# Patient Record
Sex: Female | Born: 1980 | Race: White | Hispanic: No | Marital: Married | Smoking: Never smoker
Health system: Southern US, Community
[De-identification: ages and names within clinical notes are randomized; demographics above are authoritative.]

## PROBLEM LIST (undated history)

## (undated) DIAGNOSIS — Z8759 Personal history of other complications of pregnancy, childbirth and the puerperium: Secondary | ICD-10-CM

## (undated) DIAGNOSIS — N200 Calculus of kidney: Secondary | ICD-10-CM

## (undated) HISTORY — DX: Personal history of other complications of pregnancy, childbirth and the puerperium: Z87.59

## (undated) HISTORY — DX: Calculus of kidney: N20.0

## (undated) HISTORY — PX: ANTERIOR CRUCIATE LIGAMENT REPAIR: SHX115

---

## 2007-04-11 ENCOUNTER — Ambulatory Visit (HOSPITAL_COMMUNITY): Admission: RE | Admit: 2007-04-11 | Discharge: 2007-04-11 | Payer: Self-pay | Admitting: Obstetrics

## 2007-12-04 ENCOUNTER — Ambulatory Visit (HOSPITAL_COMMUNITY): Admission: RE | Admit: 2007-12-04 | Discharge: 2007-12-04 | Payer: Self-pay | Admitting: Obstetrics

## 2008-05-11 ENCOUNTER — Inpatient Hospital Stay (HOSPITAL_COMMUNITY): Admission: AD | Admit: 2008-05-11 | Discharge: 2008-05-13 | Payer: Self-pay | Admitting: Obstetrics

## 2009-02-27 ENCOUNTER — Ambulatory Visit: Payer: Self-pay | Admitting: Advanced Practice Midwife

## 2009-02-27 ENCOUNTER — Inpatient Hospital Stay (HOSPITAL_COMMUNITY): Admission: AD | Admit: 2009-02-27 | Discharge: 2009-02-27 | Payer: Self-pay | Admitting: Obstetrics & Gynecology

## 2009-04-11 ENCOUNTER — Ambulatory Visit (HOSPITAL_COMMUNITY): Admission: RE | Admit: 2009-04-11 | Discharge: 2009-04-11 | Payer: Self-pay | Admitting: Obstetrics

## 2009-04-11 ENCOUNTER — Encounter: Payer: Self-pay | Admitting: Obstetrics

## 2009-07-04 ENCOUNTER — Inpatient Hospital Stay (HOSPITAL_COMMUNITY): Admission: AD | Admit: 2009-07-04 | Discharge: 2009-07-06 | Payer: Self-pay | Admitting: Obstetrics

## 2009-09-08 IMAGING — US US OB COMP +14 WK
2 series · 14 of 28 positions shown · non-contrast
Comparison: none

OBSTETRICAL ULTRASOUND:

 This ultrasound exam was performed in the [HOSPITAL] Ultrasound Department.  The OB US report was generated in the AS system, and faxed to the ordering physician.  This report is also available in [REDACTED] PACS.

[Series 1: us ob comp +14 wk · 12 of 68 slices shown (1 of 2)]
[im 3/68]
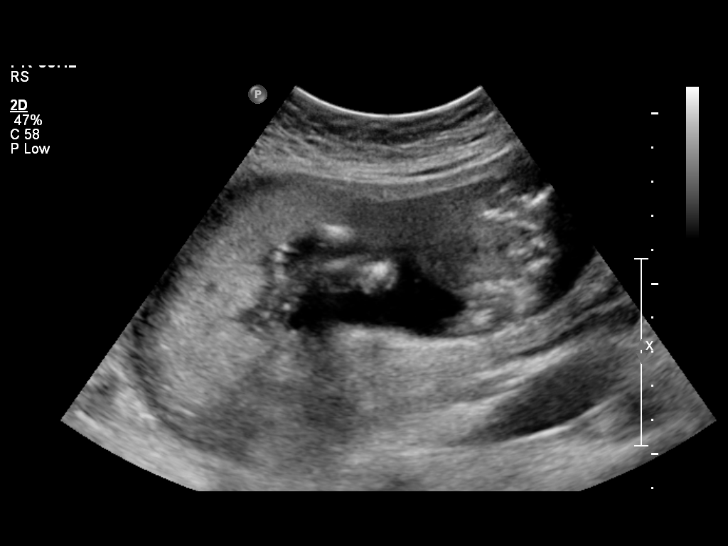
[im 9/68]
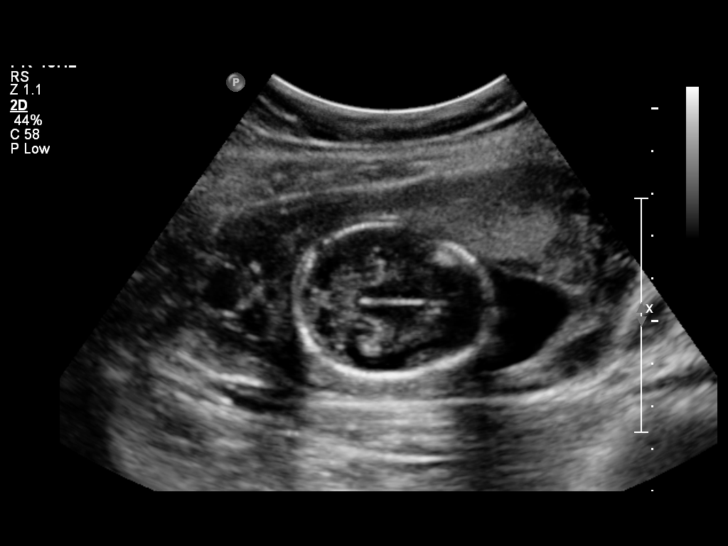
[im 15/68]
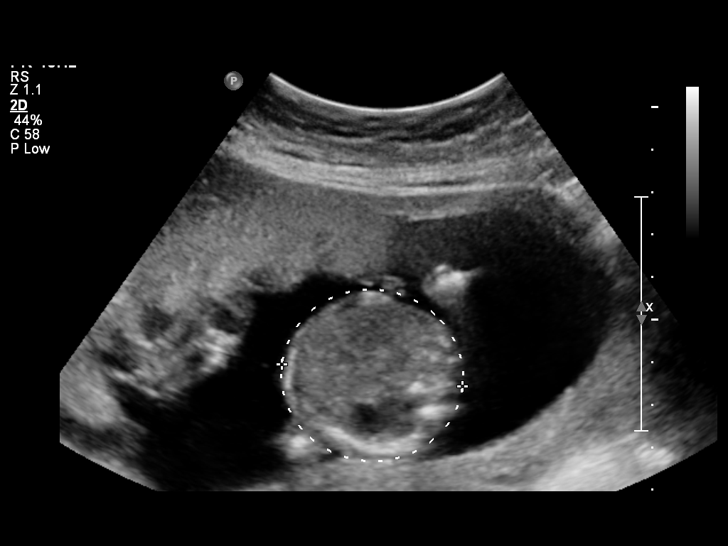
[im 21/68]
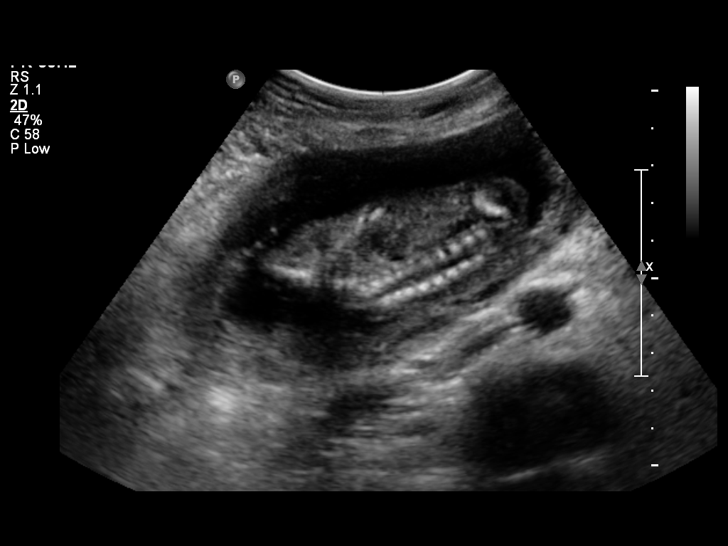
[im 27/68]
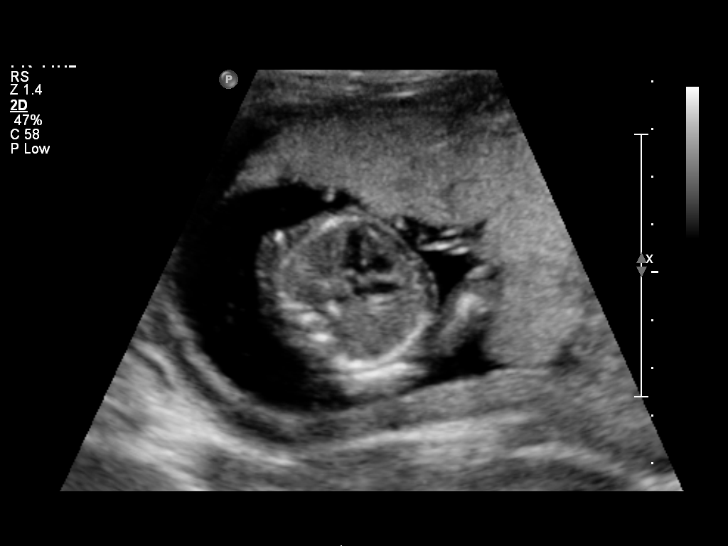
[im 33/68]
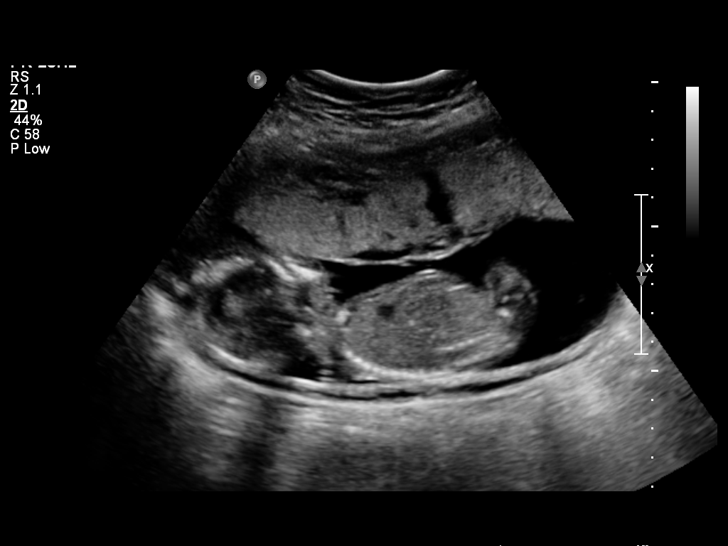
[im 38/68]
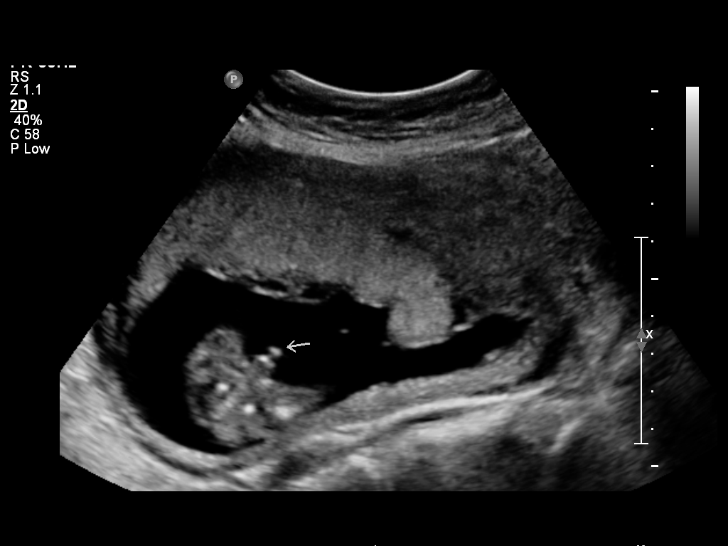
[im 44/68]
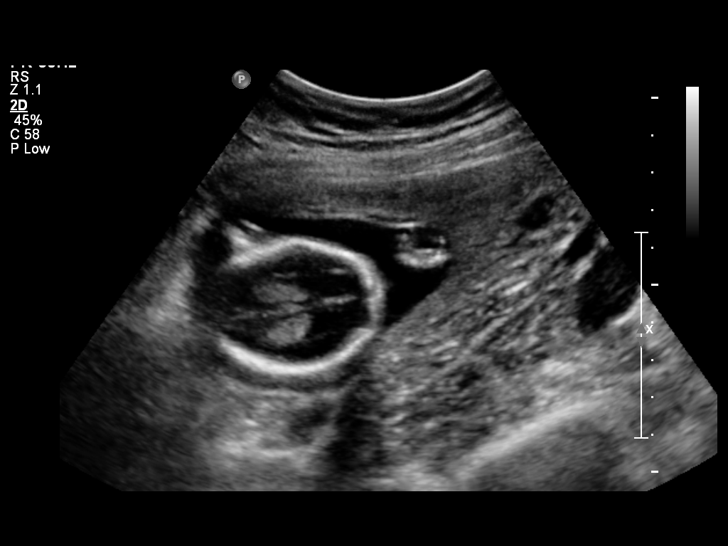
[im 50/68]
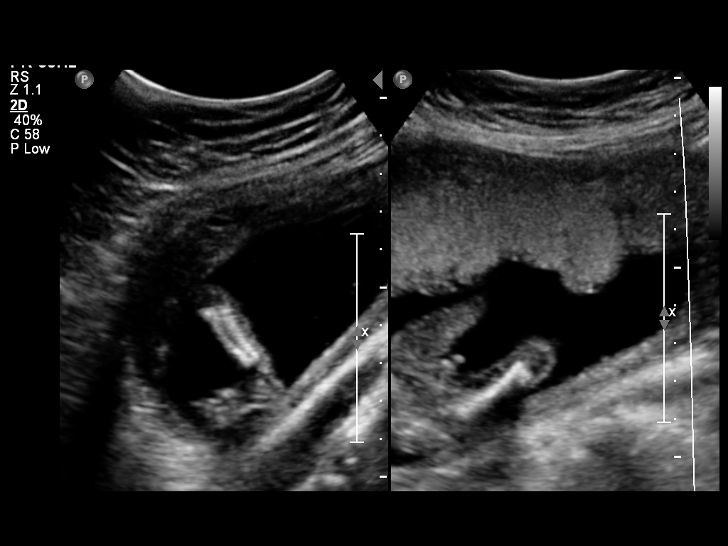
[im 56/68]
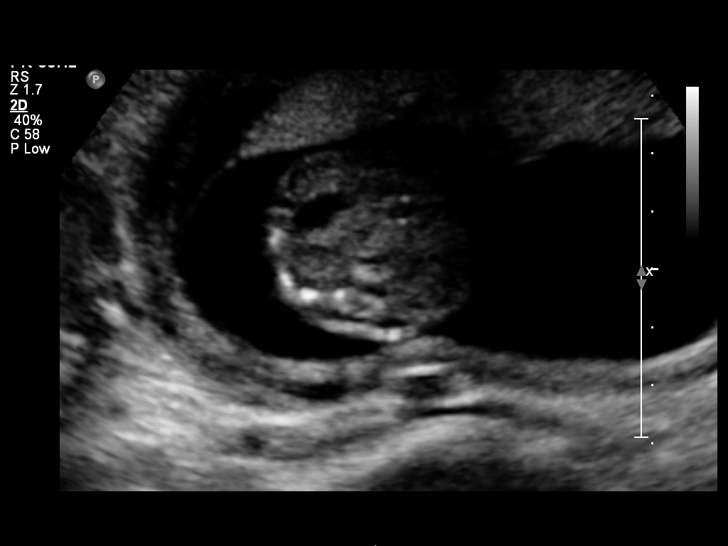
[im 62/68]
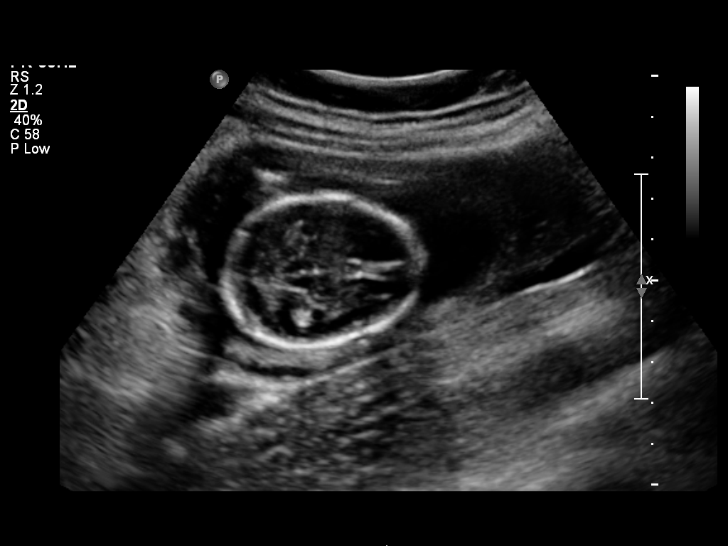
[im 68/68]
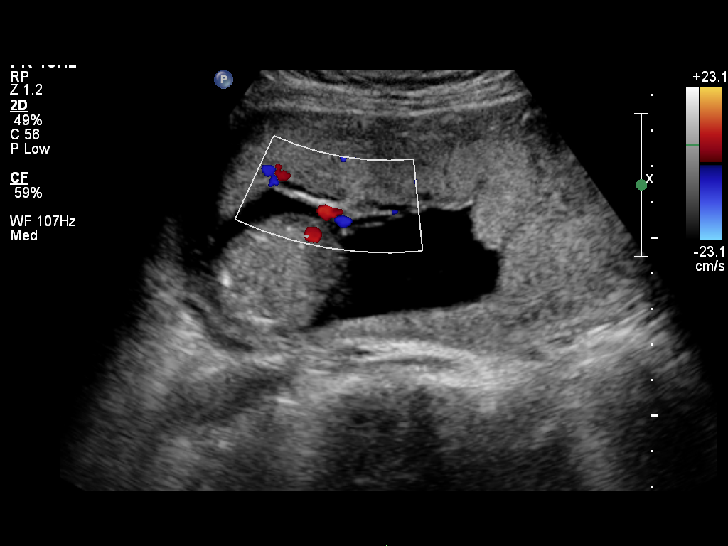

[Series 1: us ob comp +14 wk · 2 of 10 slices shown (2 of 2)]
[im 4/10]
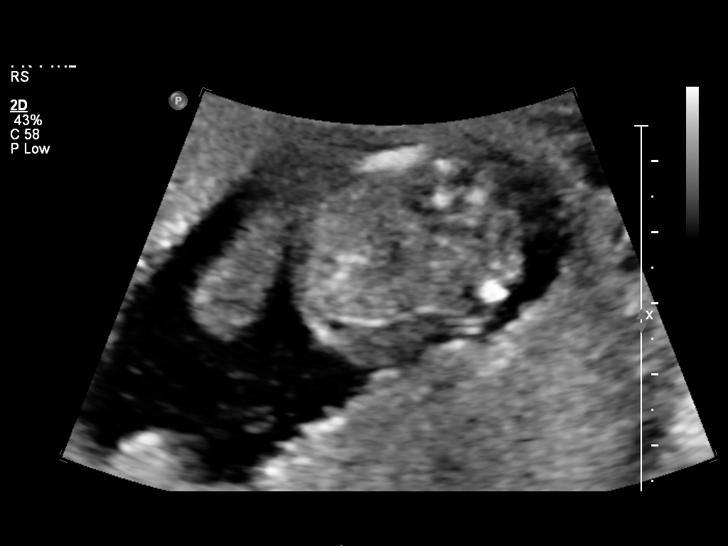
[im 10/10]
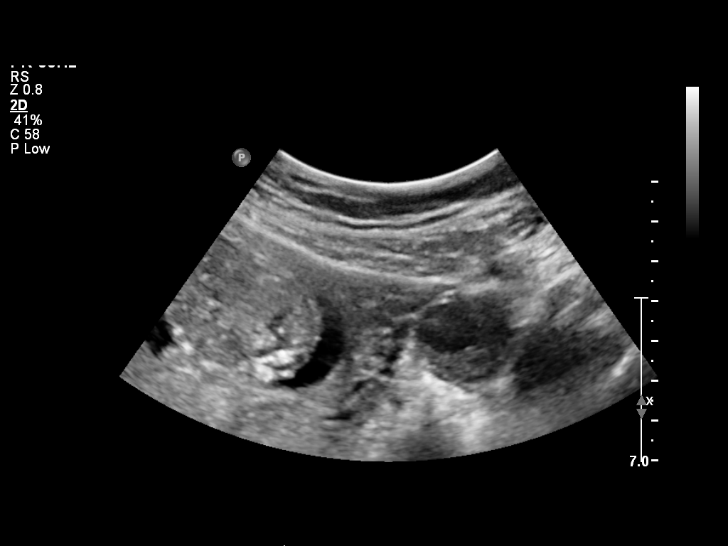

[14 of 28 positions shown; findings below may reference images not displayed]

IMPRESSION: See [facility] Obstetric US report.

## 2011-02-05 LAB — CBC
HCT: 32.6 % — ABNORMAL LOW (ref 36.0–46.0)
Hemoglobin: 11.2 g/dL — ABNORMAL LOW (ref 12.0–15.0)
MCHC: 34.5 g/dL (ref 30.0–36.0)
MCV: 93.8 fL (ref 78.0–100.0)
RBC: 3.47 MIL/uL — ABNORMAL LOW (ref 3.87–5.11)

## 2011-07-29 LAB — RPR: RPR Ser Ql: NONREACTIVE

## 2011-07-29 LAB — CBC
HCT: 32.9 — ABNORMAL LOW
Hemoglobin: 10.2 — ABNORMAL LOW
Hemoglobin: 11.4 — ABNORMAL LOW
MCHC: 34.8
MCHC: 35.2
MCV: 95
Platelets: 236
RBC: 3.47 — ABNORMAL LOW
RDW: 13.3
RDW: 13.7
WBC: 8.5

## 2015-01-10 ENCOUNTER — Ambulatory Visit (HOSPITAL_BASED_OUTPATIENT_CLINIC_OR_DEPARTMENT_OTHER)
Admission: RE | Admit: 2015-01-10 | Discharge: 2015-01-10 | Disposition: A | Discharge status: Home / Self Care | Payer: 59 | Source: Ambulatory Visit | Attending: Nurse Practitioner | Admitting: Nurse Practitioner

## 2015-01-10 ENCOUNTER — Ambulatory Visit (INDEPENDENT_AMBULATORY_CARE_PROVIDER_SITE_OTHER): Payer: 59 | Admitting: Nurse Practitioner

## 2015-01-10 VITALS — BP 118/79 | HR 98 | Temp 97.8°F | Ht 64.5 in | Wt 168.0 lb

## 2015-01-10 DIAGNOSIS — R1900 Intra-abdominal and pelvic swelling, mass and lump, unspecified site: Secondary | ICD-10-CM | POA: Diagnosis present

## 2015-01-10 DIAGNOSIS — D251 Intramural leiomyoma of uterus: Secondary | ICD-10-CM | POA: Insufficient documentation

## 2015-01-10 DIAGNOSIS — N852 Hypertrophy of uterus: Secondary | ICD-10-CM | POA: Diagnosis not present

## 2015-01-10 LAB — COMPREHENSIVE METABOLIC PANEL
ALT: 18 U/L (ref 0–35)
AST: 17 U/L (ref 0–37)
Albumin: 4.6 g/dL (ref 3.5–5.2)
Alkaline Phosphatase: 67 U/L (ref 39–117)
BUN: 18 mg/dL (ref 6–23)
CHLORIDE: 106 meq/L (ref 96–112)
CO2: 28 mEq/L (ref 19–32)
CREATININE: 0.99 mg/dL (ref 0.40–1.20)
Calcium: 9.2 mg/dL (ref 8.4–10.5)
GFR: 69.01 mL/min (ref >60.00)
GLUCOSE: 84 mg/dL (ref 70–99)
POTASSIUM: 4.2 meq/L (ref 3.5–5.1)
Sodium: 138 mEq/L (ref 135–145)
TOTAL PROTEIN: 7.1 g/dL (ref 6.0–8.3)
Total Bilirubin: 0.3 mg/dL (ref 0.2–1.2)

## 2015-01-10 LAB — URINALYSIS, MICROSCOPIC ONLY: RBC / HPF: NONE SEEN (ref 0–?)

## 2015-01-10 LAB — CBC WITH DIFFERENTIAL/PLATELET
Basophils Absolute: 0 10*3/uL (ref 0.0–0.1)
Basophils Relative: 0.3 % (ref 0.0–3.0)
EOS ABS: 0 10*3/uL (ref 0.0–0.7)
Eosinophils Relative: 0.8 % (ref 0.0–5.0)
HEMATOCRIT: 41.1 % (ref 36.0–46.0)
Hemoglobin: 13.8 g/dL (ref 12.0–15.0)
LYMPHS PCT: 20.2 % (ref 12.0–46.0)
Lymphs Abs: 0.8 10*3/uL (ref 0.7–4.0)
MCHC: 33.4 g/dL (ref 30.0–36.0)
MCV: 89.4 fl (ref 78.0–100.0)
MONO ABS: 0.3 10*3/uL (ref 0.1–1.0)
Monocytes Relative: 8 % (ref 3.0–12.0)
NEUTROS ABS: 2.9 10*3/uL (ref 1.4–7.7)
NEUTROS PCT: 70.7 % (ref 43.0–77.0)
Platelets: 226 10*3/uL (ref 150.0–400.0)
RBC: 4.6 Mil/uL (ref 3.87–5.11)
RDW: 13.3 % (ref 11.5–15.5)
WBC: 4.1 10*3/uL (ref 4.0–10.5)

## 2015-01-10 LAB — C-REACTIVE PROTEIN: CRP: 0.5 mg/dL (ref 0.5–20.0)

## 2015-01-10 NOTE — Patient Instructions (Addendum)
Please get ultra sound.  [facility] office will call with lab & imaging results.  Please keep appointment with gynecology next week.  Please schedule physical at your convenience for preventive care .  Very Nice to meet you!

## 2015-01-10 NOTE — Progress Notes (Signed)
Pre visit review using our clinic review tool, if applicable. No additional management support is needed unless otherwise documented below in the visit note. 

## 2015-01-12 ENCOUNTER — Encounter: Payer: Self-pay | Admitting: Nurse Practitioner

## 2015-01-12 DIAGNOSIS — D259 Leiomyoma of uterus, unspecified: Secondary | ICD-10-CM | POA: Insufficient documentation

## 2015-01-12 LAB — URINE CULTURE
COLONY COUNT: NO GROWTH
Organism ID, Bacteria: NO GROWTH

## 2015-01-12 NOTE — Progress Notes (Signed)
Subjective:     Mercedes Francis is a 34 y.o. female and is here to establish care. The patient reports problems - irregular MC last month & "lump" in stomach. Mercedes Francis states [facility] have always been reg until last mo she had 2 episodes of bleeding-3 wks apart, each lasting 5 days w/moderate flow. She has never had irreg cycles in past. She has taken 2 preg tests at home-both neg. Her husband had vasectomy. They have 4 children-youngest is 63 yo. She has not had womancare exam since post-natal care after last child born. Last Pap 6 ya & nml. Never had abnml Pap.  "Lump" in stomach: She has noticed a nonpainful mass in stomach for several weeks & has gained more than 10 lbs in last few mos. She denies abd & pelvic pain, diarrhea, constipation, reflux, heart burn, fever, fatigue. No contributory Fam Hx. She has appt w/gynecology next week. She has not had been established with other PCP.  History   Social History  . Marital Status: Married    Spouse Name: N/A  . Number of Children: 4  . Years of Education: N/A   Occupational History  . Not on file.   Social History Main Topics  . Smoking status: Never Smoker   . Smokeless tobacco: Not on file  . Alcohol Use: No  . Drug Use: No  . Sexual Activity: Yes    Birth Control/ Protection: Surgical   Other Topics Concern  . Not on file   Social History Narrative   Mercedes Francis lives with her husband & 4 children. 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   Health Maintenance  Topic Date Due  . PAP SMEAR  01/12/2012  . INFLUENZA VACCINE  07/15/2015 (Originally 06/01/2014)  . TETANUS/TDAP  01/12/2019  . HIV Screening  Addressed    The following portions of the patient's history were reviewed and updated as appropriate: allergies, current medications, past family history, past medical history, past social history, past surgical history and problem list.  Review of Systems Genitourinary:negative for genital lesions and vaginal discharge, dysuria, frequency, hematuria  and hesitancy Musculoskeletal:negative for myalgias   Neuro: denies HA Lungs: denies cough Heart: denies palpitations, LE edema, dizzy  Objective:    BP 118/79 mmHg  Pulse 98  Temp(Src) 97.8 F (36.6 C) (Temporal)  Ht 5' 4.5" (1.638 m)  Wt 168 lb (76.204 kg)  BMI 28.40 kg/m2  SpO2 100%  LMP 12/28/2014 General appearance: alert, cooperative, appears stated age and no distress Head: Normocephalic, without obvious abnormality, atraumatic Eyes: negative findings: lids and lashes normal and conjunctivae and sclerae normal Lungs: clear to auscultation bilaterally Heart: regular rate and rhythm, S1, S2 normal, no murmur, click, rub or gallop Abdomen: normal findings: liver span normal to percussion, soft, non-tender and spleen non-palpable and abnormal findings:  mass, located lower abdomen extending into pelvis. Round, smooth, hard, NT, about size of med orange. Pulses: femoral pulses +2, symmetrical Lymph nodes: Inguinal adenopathy: none Neurologic: Grossly normal    Assessment:Plan   1. Abdominal mass, new Likely from uterus - CBC with Differential/Platelet - Comprehensive metabolic panel - C-reactive protein - Urine culture - POCT urinalysis dipstick-nml - Urine Microscopic - POCT urine pregnancy-neg - US Pelvis Complete; Future - US Transvaginal Non-OB; Future  Keep appt w/gyn Lab & imaging Results will be faxed to gyn.

## 2015-01-13 ENCOUNTER — Telehealth: Payer: Self-pay | Admitting: Nurse Practitioner

## 2015-01-13 NOTE — Telephone Encounter (Signed)
See ph note

## 2015-01-13 NOTE — Telephone Encounter (Signed)
US shows fibroid on uterus. LM to discuss w/pt. Results will be faxed to Dr Benjie Karvonen.

## 2015-02-07 ENCOUNTER — Encounter: Payer: Self-pay | Admitting: Nurse Practitioner

## 2015-02-07 ENCOUNTER — Telehealth: Payer: Self-pay | Admitting: Nurse Practitioner

## 2015-02-07 ENCOUNTER — Ambulatory Visit (INDEPENDENT_AMBULATORY_CARE_PROVIDER_SITE_OTHER): Payer: 59 | Admitting: Nurse Practitioner

## 2015-02-07 VITALS — BP 107/71 | HR 74 | Temp 98.1°F | Ht 64.5 in | Wt 168.0 lb

## 2015-02-07 DIAGNOSIS — Z0000 Encounter for general adult medical examination without abnormal findings: Secondary | ICD-10-CM

## 2015-02-07 DIAGNOSIS — E559 Vitamin D deficiency, unspecified: Secondary | ICD-10-CM

## 2015-02-07 LAB — CBC WITH DIFFERENTIAL/PLATELET
BASOS ABS: 0 10*3/uL (ref 0.0–0.1)
BASOS PCT: 0.6 % (ref 0.0–3.0)
EOS ABS: 0.1 10*3/uL (ref 0.0–0.7)
Eosinophils Relative: 1.4 % (ref 0.0–5.0)
HCT: 40.4 % (ref 36.0–46.0)
Hemoglobin: 13.9 g/dL (ref 12.0–15.0)
LYMPHS PCT: 17.3 % (ref 12.0–46.0)
Lymphs Abs: 0.9 10*3/uL (ref 0.7–4.0)
MCHC: 34.4 g/dL (ref 30.0–36.0)
MCV: 87.8 fl (ref 78.0–100.0)
MONO ABS: 0.5 10*3/uL (ref 0.1–1.0)
Monocytes Relative: 8.7 % (ref 3.0–12.0)
NEUTROS ABS: 3.8 10*3/uL (ref 1.4–7.7)
NEUTROS PCT: 72 % (ref 43.0–77.0)
Platelets: 215 10*3/uL (ref 150.0–400.0)
RBC: 4.6 Mil/uL (ref 3.87–5.11)
RDW: 13.2 % (ref 11.5–15.5)
WBC: 5.3 10*3/uL (ref 4.0–10.5)

## 2015-02-07 LAB — LIPID PANEL
CHOL/HDL RATIO: 4
Cholesterol: 153 mg/dL (ref 0–200)
HDL: 42.6 mg/dL (ref >39.00)
LDL Cholesterol: 95 mg/dL (ref 0–99)
NONHDL: 110.4
Triglycerides: 78 mg/dL (ref 0.0–149.0)
VLDL: 15.6 mg/dL (ref 0.0–40.0)

## 2015-02-07 LAB — COMPREHENSIVE METABOLIC PANEL
ALBUMIN: 4.5 g/dL (ref 3.5–5.2)
ALK PHOS: 65 U/L (ref 39–117)
ALT: 13 U/L (ref 0–35)
AST: 16 U/L (ref 0–37)
BUN: 14 mg/dL (ref 6–23)
CALCIUM: 9.3 mg/dL (ref 8.4–10.5)
CHLORIDE: 107 meq/L (ref 96–112)
CO2: 25 mEq/L (ref 19–32)
CREATININE: 0.91 mg/dL (ref 0.40–1.20)
GFR: 76.02 mL/min (ref >60.00)
Glucose, Bld: 92 mg/dL (ref 70–99)
POTASSIUM: 4.4 meq/L (ref 3.5–5.1)
Sodium: 139 mEq/L (ref 135–145)
Total Bilirubin: 0.6 mg/dL (ref 0.2–1.2)
Total Protein: 7 g/dL (ref 6.0–8.3)

## 2015-02-07 LAB — T4, FREE: FREE T4: 1.08 ng/dL (ref 0.60–1.60)

## 2015-02-07 LAB — HEMOGLOBIN A1C: Hgb A1c MFr Bld: 4.9 % (ref 4.6–6.5)

## 2015-02-07 LAB — TSH: TSH: 1.8 u[IU]/mL (ref 0.35–4.50)

## 2015-02-07 LAB — VITAMIN D 25 HYDROXY (VIT D DEFICIENCY, FRACTURES): VITD: 21.37 ng/mL — AB (ref 30.00–100.00)

## 2015-02-07 NOTE — Patient Instructions (Signed)
Develop lifelong habits of exercise most days of the week: take a 30 minute walk. The benefits include weight loss, lower risk for heart disease, diabetes, stroke, high blood pressure, lower rates of depression & dementia, better sleep quality & bone health. Consider reading Eat to Live by Excell Seltzer for best lifelong nutrition.  My office will call with lab results. Nice to see you!    Preventive Care for Adults, Female A healthy lifestyle and preventive care can promote health and wellness. Preventive health guidelines for women include the following key practices.  A routine yearly physical is a good way to check with your caregiver about your health and preventive screening. It is a chance to share any concerns and updates on your health, and to receive a thorough exam.  Visit your dentist for a routine exam and preventive care every 6 months. Brush your teeth twice a day and floss once a day. Good oral hygiene prevents tooth decay and gum disease.  The frequency of eye exams is based on your age, health, family medical history, use of contact lenses, and other factors. Follow your caregiver's recommendations for frequency of eye exams.  Eat a healthy diet. Foods like vegetables, fruits, whole grains, low-fat dairy products, and lean protein foods contain the nutrients you need without too many calories. Decrease your intake of foods high in solid fats, added sugars, and salt. Eat the right amount of calories for you.Get information about a proper diet from your caregiver, if necessary.  Regular physical exercise is one of the most important things you can do for your health. Most adults should get at least 150 minutes of moderate-intensity exercise (any activity that increases your heart rate and causes you to sweat) each week. In addition, most adults need muscle-strengthening exercises on 2 or more days a week.  Maintain a healthy weight. The body mass index (BMI) is a screening tool  to identify possible weight problems. It provides an estimate of body fat based on height and weight. Your caregiver can help determine your BMI, and can help you achieve or maintain a healthy weight.For adults 20 years and older:  A BMI below 18.5 is considered underweight.  A BMI of 18.5 to 24.9 is normal.  A BMI of 25 to 29.9 is considered overweight.  A BMI of 30 and above is considered obese.  Maintain normal blood lipids and cholesterol levels by exercising and minimizing your intake of saturated fat. Eat a balanced diet with plenty of fruit and vegetables. Blood tests for lipids and cholesterol should begin at age 81 and be repeated every 5 years. If your lipid or cholesterol levels are high, you are over 50, or you are at high risk for heart disease, you may need your cholesterol levels checked more frequently.Ongoing high lipid and cholesterol levels should be treated with medicines if diet and exercise are not effective.  If you smoke, find out from your caregiver how to quit. If you do not use tobacco, do not start.  Lung cancer screening is recommended for adults aged 26 80 years who are at high risk for developing lung cancer because of a history of smoking. Yearly low-dose computed tomography (CT) is recommended for people who have at least a 30-pack-year history of smoking and are a current smoker or have quit within the past 15 years. A pack year of smoking is smoking an average of 1 pack of cigarettes a day for 1 year (for example: 1 pack a day for  30 years or 2 packs a day for 15 years). Yearly screening should continue until the smoker has stopped smoking for at least 15 years. Yearly screening should also be stopped for people who develop a health problem that would prevent them from having lung cancer treatment.  If you are pregnant, do not drink alcohol. If you are breastfeeding, be very cautious about drinking alcohol. If you are not pregnant and choose to drink alcohol, do  not exceed 1 drink per day. One drink is considered to be 12 ounces (355 mL) of beer, 5 ounces (148 mL) of wine, or 1.5 ounces (44 mL) of liquor.  Avoid use of street drugs. Do not share needles with anyone. Ask for help if you need support or instructions about stopping the use of drugs.  High blood pressure causes heart disease and increases the risk of stroke. Your blood pressure should be checked at least every 1 to 2 years. Ongoing high blood pressure should be treated with medicines if weight loss and exercise are not effective.  If you are 64 to 32 years old, ask your caregiver if you should take aspirin to prevent strokes.  Diabetes screening involves taking a blood sample to check your fasting blood sugar level. This should be done once every 3 years, after age 85, if you are within normal weight and without risk factors for diabetes. Testing should be considered at a younger age or be carried out more frequently if you are overweight and have at least 1 risk factor for diabetes.  Breast cancer screening is essential preventive care for women. You should practice "breast self-awareness." This means understanding the normal appearance and feel of your breasts and may include breast self-examination. Any changes detected, no matter how small, should be reported to a caregiver. Women in their 63s and 30s should have a clinical breast exam (CBE) by a caregiver as part of a regular health exam every 1 to 3 years. After age 40, women should have a CBE every year. Starting at age 42, women should consider having a mammography (breast X-ray test) every year. Women who have a family history of breast cancer should talk to their caregiver about genetic screening. Women at a high risk of breast cancer should talk to their caregivers about having magnetic resonance imaging (MRI) and a mammography every year.  Breast cancer gene (BRCA)-related cancer risk assessment is recommended for women who have family  members with BRCA-related cancers. BRCA-related cancers include breast, ovarian, tubal, and peritoneal cancers. Having family members with these cancers may be associated with an increased risk for harmful changes (mutations) in the breast cancer genes BRCA1 and BRCA2. Results of the assessment will determine the need for genetic counseling and BRCA1 and BRCA2 testing.  The Pap test is a screening test for cervical cancer. A Pap test can show cell changes on the cervix that might become cervical cancer if left untreated. A Pap test is a procedure in which cells are obtained and examined from the lower end of the uterus (cervix).  Women should have a Pap test starting at age 77.  Between ages 70 and 80, Pap tests should be repeated every 2 years.  Beginning at age 40, you should have a Pap test every 3 years as long as the past 3 Pap tests have been normal.  Some women have medical problems that increase the chance of getting cervical cancer. Talk to your caregiver about these problems. It is especially important to talk to  your caregiver if a new problem develops soon after your last Pap test. In these cases, your caregiver may recommend more frequent screening and Pap tests.  The above recommendations are the same for women who have or have not gotten the vaccine for human papillomavirus (HPV).  If you had a hysterectomy for a problem that was not cancer or a condition that could lead to cancer, then you no longer need Pap tests. Even if you no longer need a Pap test, a regular exam is a good idea to make sure no other problems are starting.  If you are between ages 85 and 14, and you have had normal Pap tests going back 10 years, you no longer need Pap tests. Even if you no longer need a Pap test, a regular exam is a good idea to make sure no other problems are starting.  If you have had past treatment for cervical cancer or a condition that could lead to cancer, you need Pap tests and screening  for cancer for at least 20 years after your treatment.  If Pap tests have been discontinued, risk factors (such as a new sexual partner) need to be reassessed to determine if screening should be resumed.  The HPV test is an additional test that may be used for cervical cancer screening. The HPV test looks for the virus that can cause the cell changes on the cervix. The cells collected during the Pap test can be tested for HPV. The HPV test could be used to screen women aged 60 years and older, and should be used in women of any age who have unclear Pap test results. After the age of 6, women should have HPV testing at the same frequency as a Pap test.  Colorectal cancer can be detected and often prevented. Most routine colorectal cancer screening begins at the age of 6 and continues through age 64. However, your caregiver may recommend screening at an earlier age if you have risk factors for colon cancer. On a yearly basis, your caregiver may provide home test kits to check for hidden blood in the stool. Use of a small camera at the end of a tube, to directly examine the colon (sigmoidoscopy or colonoscopy), can detect the earliest forms of colorectal cancer. Talk to your caregiver about this at age 75, when routine screening begins. Direct examination of the colon should be repeated every 5 to 10 years through age 71, unless early forms of pre-cancerous polyps or small growths are found.  Hepatitis C blood testing is recommended for all people born from [birth year] through 1965 and any individual with known risks for hepatitis C.  Practice safe sex. Use condoms and avoid high-risk sexual practices to reduce the spread of sexually transmitted infections (STIs). STIs include gonorrhea, chlamydia, syphilis, trichomonas, herpes, HPV, and human immunodeficiency virus (HIV). Herpes, HIV, and HPV are viral illnesses that have no cure. They can result in disability, cancer, and death. Sexually active women aged 10  and younger should be checked for chlamydia. Older women with new or multiple partners should also be tested for chlamydia. Testing for other STIs is recommended if you are sexually active and at increased risk.  Osteoporosis is a disease in which the bones lose minerals and strength with aging. This can result in serious bone fractures. The risk of osteoporosis can be identified using a bone density scan. Women ages 66 and over and women at risk for fractures or osteoporosis should discuss screening with their caregivers.  Ask your caregiver whether you should take a calcium supplement or vitamin D to reduce the rate of osteoporosis.  Menopause can be associated with physical symptoms and risks. Hormone replacement therapy is available to decrease symptoms and risks. You should talk to your caregiver about whether hormone replacement therapy is right for you.  Use sunscreen. Apply sunscreen liberally and repeatedly throughout the day. You should seek shade when your shadow is shorter than you. Protect yourself by wearing long sleeves, pants, a wide-brimmed hat, and sunglasses year round, whenever you are outdoors.  Once a month, do a whole body skin exam, using a mirror to look at the skin on your back. Notify your caregiver of new moles, moles that have irregular borders, moles that are larger than a pencil eraser, or moles that have changed in shape or color.  Stay current with required immunizations.  Influenza vaccine. All adults should be immunized every year.  Tetanus, diphtheria, and acellular pertussis (Td, Tdap) vaccine. Pregnant women should receive 1 dose of Tdap vaccine during each pregnancy. The dose should be obtained regardless of the length of time since the last dose. Immunization is preferred during the 27th to 36th week of gestation. An adult who has not previously received Tdap or who does not know her vaccine status should receive 1 dose of Tdap. This initial dose should be  followed by tetanus and diphtheria toxoids (Td) booster doses every 10 years. Adults with an unknown or incomplete history of completing a 3-dose immunization series with Td-containing vaccines should begin or complete a primary immunization series including a Tdap dose. Adults should receive a Td booster every 10 years.  Varicella vaccine. An adult without evidence of immunity to varicella should receive 2 doses or a second dose if she has previously received 1 dose. Pregnant females who do not have evidence of immunity should receive the first dose after pregnancy. This first dose should be obtained before leaving the health care facility. The second dose should be obtained 4 8 weeks after the first dose.  Human papillomavirus (HPV) vaccine. Females aged 81 26 years who have not received the vaccine previously should obtain the 3-dose series. The vaccine is not recommended for use in pregnant females. However, pregnancy testing is not needed before receiving a dose. If a female is found to be pregnant after receiving a dose, no treatment is needed. In that case, the remaining doses should be delayed until after the pregnancy. Immunization is recommended for any person with an immunocompromised condition through the age of 29 years if she did not get any or all doses earlier. During the 3-dose series, the second dose should be obtained 4 8 weeks after the first dose. The third dose should be obtained 24 weeks after the first dose and 16 weeks after the second dose.  Zoster vaccine. One dose is recommended for adults aged 73 years or older unless certain conditions are present.  Measles, mumps, and rubella (MMR) vaccine. Adults born before 44 generally are considered immune to measles and mumps. Adults born in [birth year] or later should have 1 or more doses of MMR vaccine unless there is a contraindication to the vaccine or there is laboratory evidence of immunity to each of the three diseases. A routine second  dose of MMR vaccine should be obtained at least 28 days after the first dose for students attending postsecondary schools, health care workers, or international travelers. People who received inactivated measles vaccine or an unknown type of measles vaccine  during 1963 1967 should receive 2 doses of MMR vaccine. People who received inactivated mumps vaccine or an unknown type of mumps vaccine before 1979 and are at high risk for mumps infection should consider immunization with 2 doses of MMR vaccine. For females of childbearing age, rubella immunity should be determined. If there is no evidence of immunity, females who are not pregnant should be vaccinated. If there is no evidence of immunity, females who are pregnant should delay immunization until after pregnancy. Unvaccinated health care workers born before 50 who lack laboratory evidence of measles, mumps, or rubella immunity or laboratory confirmation of disease should consider measles and mumps immunization with 2 doses of MMR vaccine or rubella immunization with 1 dose of MMR vaccine.  Pneumococcal 13-valent conjugate (PCV13) vaccine. When indicated, a person who is uncertain of her immunization history and has no record of immunization should receive the PCV13 vaccine. An adult aged 40 years or older who has certain medical conditions and has not been previously immunized should receive 1 dose of PCV13 vaccine. This PCV13 should be followed with a dose of pneumococcal polysaccharide (PPSV23) vaccine. The PPSV23 vaccine dose should be obtained at least 8 weeks after the dose of PCV13 vaccine. An adult aged 38 years or older who has certain medical conditions and previously received 1 or more doses of PPSV23 vaccine should receive 1 dose of PCV13. The PCV13 vaccine dose should be obtained 1 or more years after the last PPSV23 vaccine dose.  Pneumococcal polysaccharide (PPSV23) vaccine. When PCV13 is also indicated, PCV13 should be obtained first. All  adults aged 3 years and older should be immunized. An adult younger than age 30 years who has certain medical conditions should be immunized. Any person who resides in a nursing home or long-term care facility should be immunized. An adult smoker should be immunized. People with an immunocompromised condition and certain other conditions should receive both PCV13 and PPSV23 vaccines. People with human immunodeficiency virus (HIV) infection should be immunized as soon as possible after diagnosis. Immunization during chemotherapy or radiation therapy should be avoided. Routine use of PPSV23 vaccine is not recommended for American Indians, [place] Natives, or people younger than 65 years unless there are medical conditions that require PPSV23 vaccine. When indicated, people who have unknown immunization and have no record of immunization should receive PPSV23 vaccine. One-time revaccination 5 years after the first dose of PPSV23 is recommended for people aged 26 64 years who have chronic kidney failure, nephrotic syndrome, asplenia, or immunocompromised conditions. People who received 1 2 doses of PPSV23 before age 66 years should receive another dose of PPSV23 vaccine at age 60 years or later if at least 5 years have passed since the previous dose. Doses of PPSV23 are not needed for people immunized with PPSV23 at or after age 60 years.  Meningococcal vaccine. Adults with asplenia or persistent complement component deficiencies should receive 2 doses of quadrivalent meningococcal conjugate (MenACWY-D) vaccine. The doses should be obtained at least 2 months apart. Microbiologists working with certain meningococcal bacteria, [place] recruits, people at risk during an outbreak, and people who travel to or live in countries with a high rate of meningitis should be immunized. A first-year college student up through age 10 years who is living in a residence hall should receive a dose if she did not receive a dose on or  after her 16th birthday. Adults who have certain high-risk conditions should receive one or more doses of vaccine.  Hepatitis A vaccine.  Adults who wish to be protected from this disease, have certain high-risk conditions, work with hepatitis A-infected animals, work in hepatitis A research labs, or travel to or work in countries with a high rate of hepatitis A should be immunized. Adults who were previously unvaccinated and who anticipate close contact with an international adoptee during the first 60 days after arrival in the [place] from a country with a high rate of hepatitis A should be immunized.  Hepatitis B vaccine. Adults who wish to be protected from this disease, have certain high-risk conditions, may be exposed to blood or other infectious body fluids, are household contacts or sex partners of hepatitis B positive people, are clients or workers in certain care facilities, or travel to or work in countries with a high rate of hepatitis B should be immunized.  Haemophilus influenzae type b (Hib) vaccine. A previously unvaccinated person with asplenia or sickle cell disease or having a scheduled splenectomy should receive 1 dose of Hib vaccine. Regardless of previous immunization, a recipient of a hematopoietic stem cell transplant should receive a 3-dose series 6 12 months after her successful transplant. Hib vaccine is not recommended for adults with HIV infection. Preventive Services / Frequency Ages 50 to 76  Blood pressure check.** / Every 1 to 2 years.  Lipid and cholesterol check.** / Every 5 years beginning at age 7.  Clinical breast exam.** / Every 3 years for women in their 14s and 59s.  BRCA-related cancer risk assessment.** / For women who have family members with a BRCA-related cancer (breast, ovarian, tubal, or peritoneal cancers).  Pap test.** / Every 2 years from ages 84 through 11. Every 3 years starting at age 47 through age 58 or 67 with a history of 3 consecutive  normal Pap tests.  HPV screening.** / Every 3 years from ages 19 through ages 16 to 86 with a history of 3 consecutive normal Pap tests.  Hepatitis C blood test.** / For any individual with known risks for hepatitis C.  Skin self-exam. / Monthly.  Influenza vaccine. / Every year.  Tetanus, diphtheria, and acellular pertussis (Tdap, Td) vaccine.** / Consult your caregiver. Pregnant women should receive 1 dose of Tdap vaccine during each pregnancy. 1 dose of Td every 10 years.  Varicella vaccine.** / Consult your caregiver. Pregnant females who do not have evidence of immunity should receive the first dose after pregnancy.  HPV vaccine. / 3 doses over 6 months, if 61 and younger. The vaccine is not recommended for use in pregnant females. However, pregnancy testing is not needed before receiving a dose.  Measles, mumps, rubella (MMR) vaccine.** / You need at least 1 dose of MMR if you were born in 1957 or later. You may also need a 2nd dose. For females of childbearing age, rubella immunity should be determined. If there is no evidence of immunity, females who are not pregnant should be vaccinated. If there is no evidence of immunity, females who are pregnant should delay immunization until after pregnancy.  Pneumococcal 13-valent conjugate (PCV13) vaccine.** / Consult your caregiver.  Pneumococcal polysaccharide (PPSV23) vaccine.** / 1 to 2 doses if you smoke cigarettes or if you have certain conditions.  Meningococcal vaccine.** / 1 dose if you are age 23 to 66 years and a Market researcher living in a residence hall, or have one of several medical conditions, you need to get vaccinated against meningococcal disease. You may also need additional booster doses.  Hepatitis A vaccine.** / Consult your  caregiver.  Hepatitis B vaccine.** / Consult your caregiver.  Haemophilus influenzae type b (Hib) vaccine.** / Consult your caregiver. Ages 75 to 83  Blood pressure check.** / Every  1 to 2 years.  Lipid and cholesterol check.** / Every 5 years beginning at age 8.  Lung cancer screening. / Every year if you are aged 66 80 years and have a 30-pack-year history of smoking and currently smoke or have quit within the past 15 years. Yearly screening is stopped once you have quit smoking for at least 15 years or develop a health problem that would prevent you from having lung cancer treatment.  Clinical breast exam.** / Every year after age 37.  BRCA-related cancer risk assessment.** / For women who have family members with a BRCA-related cancer (breast, ovarian, tubal, or peritoneal cancers).  Mammogram.** / Every year beginning at age 75 and continuing for as long as you are in good health. Consult with your caregiver.  Pap test.** / Every 3 years starting at age 74 through age 62 or 29 with a history of 3 consecutive normal Pap tests.  HPV screening.** / Every 3 years from ages 23 through ages 53 to 63 with a history of 3 consecutive normal Pap tests.  Fecal occult blood test (FOBT) of stool. / Every year beginning at age 75 and continuing until age 54. You may not need to do this test if you get a colonoscopy every 10 years.  Flexible sigmoidoscopy or colonoscopy.** / Every 5 years for a flexible sigmoidoscopy or every 10 years for a colonoscopy beginning at age 37 and continuing until age 94.  Hepatitis C blood test.** / For all people born from [birth year] through 1965 and any individual with known risks for hepatitis C.  Skin self-exam. / Monthly.  Influenza vaccine. / Every year.  Tetanus, diphtheria, and acellular pertussis (Tdap/Td) vaccine.** / Consult your caregiver. Pregnant women should receive 1 dose of Tdap vaccine during each pregnancy. 1 dose of Td every 10 years.  Varicella vaccine.** / Consult your caregiver. Pregnant females who do not have evidence of immunity should receive the first dose after pregnancy.  Zoster vaccine.** / 1 dose for adults aged 20  years or older.  Measles, mumps, rubella (MMR) vaccine.** / You need at least 1 dose of MMR if you were born in 1957 or later. You may also need a 2nd dose. For females of childbearing age, rubella immunity should be determined. If there is no evidence of immunity, females who are not pregnant should be vaccinated. If there is no evidence of immunity, females who are pregnant should delay immunization until after pregnancy.  Pneumococcal 13-valent conjugate (PCV13) vaccine.** / Consult your caregiver.  Pneumococcal polysaccharide (PPSV23) vaccine.** / 1 to 2 doses if you smoke cigarettes or if you have certain conditions.  Meningococcal vaccine.** / Consult your caregiver.  Hepatitis A vaccine.** / Consult your caregiver.  Hepatitis B vaccine.** / Consult your caregiver.  Haemophilus influenzae type b (Hib) vaccine.** / Consult your caregiver. Ages 6 and over  Blood pressure check.** / Every 1 to 2 years.  Lipid and cholesterol check.** / Every 5 years beginning at age 24.  Lung cancer screening. / Every year if you are aged 66 80 years and have a 30-pack-year history of smoking and currently smoke or have quit within the past 15 years. Yearly screening is stopped once you have quit smoking for at least 15 years or develop a health problem that would prevent you from having  lung cancer treatment.  Clinical breast exam.** / Every year after age 21.  BRCA-related cancer risk assessment.** / For women who have family members with a BRCA-related cancer (breast, ovarian, tubal, or peritoneal cancers).  Mammogram.** / Every year beginning at age 82 and continuing for as long as you are in good health. Consult with your caregiver.  Pap test.** / Every 3 years starting at age 3 through age 45 or 21 with a 3 consecutive normal Pap tests. Testing can be stopped between 65 and 70 with 3 consecutive normal Pap tests and no abnormal Pap or HPV tests in the past 10 years.  HPV screening.** /  Every 3 years from ages 41 through ages 33 or 90 with a history of 3 consecutive normal Pap tests. Testing can be stopped between 65 and 70 with 3 consecutive normal Pap tests and no abnormal Pap or HPV tests in the past 10 years.  Fecal occult blood test (FOBT) of stool. / Every year beginning at age 31 and continuing until age 54. You may not need to do this test if you get a colonoscopy every 10 years.  Flexible sigmoidoscopy or colonoscopy.** / Every 5 years for a flexible sigmoidoscopy or every 10 years for a colonoscopy beginning at age 93 and continuing until age 71.  Hepatitis C blood test.** / For all people born from [birth year] through 1963 and any individual with known risks for hepatitis C.  Osteoporosis screening.** / A one-time screening for women ages 81 and over and women at risk for fractures or osteoporosis.  Skin self-exam. / Monthly.  Influenza vaccine. / Every year.  Tetanus, diphtheria, and acellular pertussis (Tdap/Td) vaccine.** / 1 dose of Td every 10 years.  Varicella vaccine.** / Consult your caregiver.  Zoster vaccine.** / 1 dose for adults aged 71 years or older.  Pneumococcal 13-valent conjugate (PCV13) vaccine.** / Consult your caregiver.  Pneumococcal polysaccharide (PPSV23) vaccine.** / 1 dose for all adults aged 105 years and older.  Meningococcal vaccine.** / Consult your caregiver.  Hepatitis A vaccine.** / Consult your caregiver.  Hepatitis B vaccine.** / Consult your caregiver.  Haemophilus influenzae type b (Hib) vaccine.** / Consult your caregiver. ** Family history and personal history of risk and conditions may change your caregiver's recommendations. Document Released: 12/14/2001 Document Revised: 02/12/2013 Document Reviewed: 03/15/2011 [facility] Patient Information 2014 [place], [place].

## 2015-02-07 NOTE — Progress Notes (Signed)
Pre visit review using our clinic review tool, if applicable. No additional management support is needed unless otherwise documented below in the visit note. 

## 2015-02-07 NOTE — Telephone Encounter (Signed)
pls call pt: Advise labs look good. Vit d too low. Start D3 5000 iu qd. Take for 12 weeks. Sched lab appt in 12 weeks to recheck level. Fax labs to Dr Benjie Karvonen (gynecology).

## 2015-02-08 DIAGNOSIS — Z0000 Encounter for general adult medical examination without abnormal findings: Secondary | ICD-10-CM | POA: Insufficient documentation

## 2015-02-08 NOTE — Progress Notes (Signed)
Subjective:     Mercedes Francis is a 34 y.o. female and is here for a comprehensive physical exam. The patient reports no problems. She will have gyn surgery next month to remove uterine fibroid. Had Pap last month. She feels well, exercising regularly, eating fruits & vegetables daily. History   Social History  . Marital Status: Married    Spouse Name: N/A  . Number of Children: 4  . Years of Education: N/A   Occupational History  . Not on file.   Social History Main Topics  . Smoking status: Never Smoker   . Smokeless tobacco: Not on file  . Alcohol Use: No  . Drug Use: No  . Sexual Activity: Yes    Birth Control/ Protection: Surgical   Other Topics Concern  . Not on file   Social History Narrative   Mercedes Francis lives with her husband & 4 children. 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   Health Maintenance  Topic Date Due  . INFLUENZA VACCINE  07/15/2015 (Originally 06/02/2015)  . PAP SMEAR  01/19/2018  . TETANUS/TDAP  01/12/2019  . HIV Screening  Addressed    The following portions of the patient's history were reviewed and updated as appropriate: allergies, current medications, past medical history, past social history, past surgical history and problem list.  Review of Systems Pertinent items are noted in HPI.   Objective:    BP 107/71 mmHg  Pulse 74  Temp(Src) 98.1 F (36.7 C) (Oral)  Ht 5' 4.5" (1.638 m)  Wt 168 lb (76.204 kg)  BMI 28.40 kg/m2  SpO2 99%  LMP 01/15/2015 General appearance: alert, cooperative, appears stated age and no distress Head: Normocephalic, without obvious abnormality, atraumatic Eyes: negative findings: lids and lashes normal, conjunctivae and sclerae normal, corneas clear and pupils equal, round, reactive to light and accomodation Ears: normal TM's and external ear canals both ears Throat: lips, mucosa, and tongue normal; teeth and gums normal Neck: no adenopathy, supple, symmetrical, trachea midline and thyroid not enlarged, symmetric, no  tenderness/mass/nodules Lungs: clear to auscultation bilaterally Heart: regular rate and rhythm, S1, S2 normal, no murmur, click, rub or gallop Abdomen: soft, non-tender; bowel sounds normal; no masses,  no organomegaly and unable to palpate abd mass today-was very large suprapubic mass sz of baseball at last OV Extremities: extremities normal, atraumatic, no cyanosis or edema Pulses: 2+ and symmetric Skin: Skin color, texture, turgor normal. No rashes or lesions Lymph nodes: Cervical, supraclavicular, and axillary nodes normal. Neurologic: Grossly normal    Assessment:Plan  1. Preventative health care - CBC with Differential/Platelet - Comprehensive metabolic panel - TSH - T4, free - Hemoglobin A1c - Lipid panel - Vit D  25 hydroxy (rtn osteoporosis monitoring)  F/u 1 yr, prn labs

## 2015-02-10 NOTE — Telephone Encounter (Signed)
Called and informed patient. Patient will CB to schedule lab appointment.  Labs faxed to Dr. Benjie Karvonen at [phone]

## 2015-03-11 ENCOUNTER — Other Ambulatory Visit: Payer: Self-pay | Admitting: Obstetrics & Gynecology

## 2015-03-13 NOTE — Patient Instructions (Addendum)
   Your procedure is scheduled on:  Friday, May 20  Enter through the Main Entrance of [facility] at: 6 AM Pick up the phone at the desk and dial [phone] and inform us of your arrival.  Please call this number if you have any problems the morning of surgery: [phone]  Remember: Do not eat or drink after midnight: Thursday Take these medicines the morning of surgery with a SIP OF WATER:  None  Do not wear jewelry, make-up, or FINGER nail polish No metal in your hair or on your body. Do not wear lotions, powders, perfumes.  You may wear deodorant.  Do not bring valuables to the hospital. Contacts, dentures or bridgework may not be worn into surgery.  Leave suitcase in the car. After Surgery it may be brought to your room. For patients being admitted to the hospital, checkout time is 11:00am the day of discharge.  Home with husband Matt cell [phone]

## 2015-03-14 ENCOUNTER — Encounter (HOSPITAL_COMMUNITY): Payer: Self-pay

## 2015-03-14 ENCOUNTER — Encounter (HOSPITAL_COMMUNITY)
Admission: RE | Admit: 2015-03-14 | Discharge: 2015-03-14 | Disposition: A | Discharge status: Home / Self Care | Payer: 59 | Source: Ambulatory Visit | Attending: Obstetrics & Gynecology | Admitting: Obstetrics & Gynecology

## 2015-03-14 DIAGNOSIS — Z01818 Encounter for other preprocedural examination: Secondary | ICD-10-CM | POA: Diagnosis not present

## 2015-03-14 LAB — CBC
HCT: 37.5 % (ref 36.0–46.0)
Hemoglobin: 12.9 g/dL (ref 12.0–15.0)
MCH: 30.4 pg (ref 26.0–34.0)
MCHC: 34.4 g/dL (ref 30.0–36.0)
MCV: 88.4 fL (ref 78.0–100.0)
PLATELETS: 178 10*3/uL (ref 150–400)
RBC: 4.24 MIL/uL (ref 3.87–5.11)
RDW: 12.7 % (ref 11.5–15.5)
WBC: 4.2 10*3/uL (ref 4.0–10.5)

## 2015-03-18 NOTE — Pre-Procedure Instructions (Signed)
Mercedes Francis talked with patient concerning positive antibody screening of blood bank specimen. Patient will come in on 5/19 between 8-2 for T&S and will wear blood bank bracelet back for surgery 5/20.

## 2015-03-20 ENCOUNTER — Other Ambulatory Visit (HOSPITAL_COMMUNITY)
Admission: AD | Admit: 2015-03-20 | Discharge: 2015-03-20 | Disposition: A | Discharge status: Home / Self Care | Payer: 59 | Source: Ambulatory Visit | Attending: Obstetrics & Gynecology | Admitting: Obstetrics & Gynecology

## 2015-03-20 LAB — TYPE AND SCREEN
ABO/RH(D): O POS
Antibody Screen: POSITIVE
DAT, IGG: NEGATIVE
PT AG Type: NEGATIVE
UNIT DIVISION: 0
Unit division: 0

## 2015-03-20 LAB — BASIC METABOLIC PANEL
Anion gap: 6 (ref 5–15)
BUN: 13 mg/dL (ref 6–20)
CALCIUM: 9.1 mg/dL (ref 8.9–10.3)
CO2: 27 mmol/L (ref 22–32)
Chloride: 106 mmol/L (ref 101–111)
Creatinine, Ser: 0.9 mg/dL (ref 0.44–1.00)
GFR calc Af Amer: >60 mL/min (ref >60)
Glucose, Bld: 93 mg/dL (ref 65–99)
Potassium: 4.4 mmol/L (ref 3.5–5.1)
Sodium: 139 mmol/L (ref 135–145)

## 2015-03-21 ENCOUNTER — Ambulatory Visit (HOSPITAL_COMMUNITY)
Admission: RE | Admit: 2015-03-21 | Discharge: 2015-03-21 | Disposition: A | Discharge status: Home / Self Care | Payer: 59 | Source: Ambulatory Visit | Attending: Obstetrics & Gynecology | Admitting: Obstetrics & Gynecology

## 2015-03-21 ENCOUNTER — Ambulatory Visit (HOSPITAL_COMMUNITY): Payer: 59 | Admitting: Anesthesiology

## 2015-03-21 ENCOUNTER — Encounter (HOSPITAL_COMMUNITY): Payer: Self-pay | Admitting: *Deleted

## 2015-03-21 ENCOUNTER — Encounter (HOSPITAL_COMMUNITY)
Admission: RE | Disposition: A | Discharge status: Home / Self Care | Payer: Self-pay | Source: Ambulatory Visit | Attending: Obstetrics & Gynecology

## 2015-03-21 DIAGNOSIS — D259 Leiomyoma of uterus, unspecified: Secondary | ICD-10-CM | POA: Insufficient documentation

## 2015-03-21 DIAGNOSIS — Z885 Allergy status to narcotic agent status: Secondary | ICD-10-CM | POA: Diagnosis not present

## 2015-03-21 DIAGNOSIS — Z9889 Other specified postprocedural states: Secondary | ICD-10-CM

## 2015-03-21 DIAGNOSIS — Z87442 Personal history of urinary calculi: Secondary | ICD-10-CM | POA: Diagnosis not present

## 2015-03-21 HISTORY — PX: ROBOT ASSISTED MYOMECTOMY: SHX5142

## 2015-03-21 LAB — PREGNANCY, URINE: PREG TEST UR: NEGATIVE

## 2015-03-21 SURGERY — ROBOTIC ASSISTED MYOMECTOMY
Anesthesia: General | Site: Abdomen

## 2015-03-21 MED ORDER — SCOPOLAMINE 1 MG/3DAYS TD PT72
1.0000 | MEDICATED_PATCH | Freq: Once | TRANSDERMAL | Status: DC
  Administered 2015-03-21: 1.5 mg via TRANSDERMAL

## 2015-03-21 MED ORDER — MENTHOL 3 MG MT LOZG: 1.0000 | LOZENGE | OROMUCOSAL | Status: DC | PRN

## 2015-03-21 MED ORDER — MIDAZOLAM HCL 2 MG/2ML IJ SOLN
INTRAMUSCULAR | Status: DC | PRN
  Administered 2015-03-21: 2 mg via INTRAVENOUS

## 2015-03-21 MED ORDER — ONDANSETRON HCL 4 MG PO TABS: 4.0000 mg | ORAL_TABLET | Freq: Four times a day (QID) | ORAL | Status: DC | PRN

## 2015-03-21 MED ORDER — HYDROMORPHONE HCL 1 MG/ML IJ SOLN: 0.2500 mg | INTRAMUSCULAR | Status: DC | PRN

## 2015-03-21 MED ORDER — GLYCOPYRROLATE 0.2 MG/ML IJ SOLN
INTRAMUSCULAR | Status: DC | PRN
  Administered 2015-03-21: 0.2 mg via INTRAVENOUS
  Administered 2015-03-21: 0.6 mg via INTRAVENOUS

## 2015-03-21 MED ORDER — GLYCOPYRROLATE 0.2 MG/ML IJ SOLN
INTRAMUSCULAR | Status: AC
  Filled 2015-03-21: qty 3

## 2015-03-21 MED ORDER — ROCURONIUM BROMIDE 100 MG/10ML IV SOLN
INTRAVENOUS | Status: DC | PRN
  Administered 2015-03-21: 10 mg via INTRAVENOUS
  Administered 2015-03-21 (×2): 20 mg via INTRAVENOUS
  Administered 2015-03-21: 50 mg via INTRAVENOUS

## 2015-03-21 MED ORDER — OXYCODONE HCL 5 MG/5ML PO SOLN: 5.0000 mg | Freq: Once | ORAL | Status: DC | PRN

## 2015-03-21 MED ORDER — OXYCODONE-ACETAMINOPHEN 5-325 MG PO TABS
1.0000 | ORAL_TABLET | ORAL | Status: DC | PRN
  Administered 2015-03-21 (×2): 1 via ORAL
  Filled 2015-03-21 (×2): qty 1

## 2015-03-21 MED ORDER — NEOSTIGMINE METHYLSULFATE 10 MG/10ML IV SOLN
INTRAVENOUS | Status: AC
  Filled 2015-03-21: qty 1

## 2015-03-21 MED ORDER — LACTATED RINGERS IV SOLN
INTRAVENOUS | Status: DC
  Administered 2015-03-21: 16:00:00 via INTRAVENOUS

## 2015-03-21 MED ORDER — ACETAMINOPHEN 10 MG/ML IV SOLN
1000.0000 mg | Freq: Once | INTRAVENOUS | Status: AC
  Administered 2015-03-21: 1000 mg via INTRAVENOUS
  Filled 2015-03-21: qty 100

## 2015-03-21 MED ORDER — ARTIFICIAL TEARS OP OINT
TOPICAL_OINTMENT | OPHTHALMIC | Status: AC
  Filled 2015-03-21: qty 3.5

## 2015-03-21 MED ORDER — SODIUM CHLORIDE 0.9 % IJ SOLN
INTRAMUSCULAR | Status: AC
  Filled 2015-03-21: qty 10

## 2015-03-21 MED ORDER — ROPIVACAINE HCL 5 MG/ML IJ SOLN
INTRAMUSCULAR | Status: AC
  Filled 2015-03-21: qty 60

## 2015-03-21 MED ORDER — ONDANSETRON HCL 4 MG/2ML IJ SOLN
INTRAMUSCULAR | Status: DC | PRN
  Administered 2015-03-21: 4 mg via INTRAVENOUS

## 2015-03-21 MED ORDER — KETOROLAC TROMETHAMINE 30 MG/ML IJ SOLN
INTRAMUSCULAR | Status: AC
  Filled 2015-03-21: qty 1

## 2015-03-21 MED ORDER — ONDANSETRON HCL 4 MG/2ML IJ SOLN: 4.0000 mg | Freq: Four times a day (QID) | INTRAMUSCULAR | Status: DC | PRN

## 2015-03-21 MED ORDER — NEOSTIGMINE METHYLSULFATE 10 MG/10ML IV SOLN
INTRAVENOUS | Status: DC | PRN
  Administered 2015-03-21: 4 mg via INTRAVENOUS

## 2015-03-21 MED ORDER — OXYCODONE-ACETAMINOPHEN 5-325 MG PO TABS: 1.0000 | ORAL_TABLET | ORAL | Status: AC | PRN

## 2015-03-21 MED ORDER — PROPOFOL 10 MG/ML IV BOLUS
INTRAVENOUS | Status: AC
  Filled 2015-03-21: qty 20

## 2015-03-21 MED ORDER — VASOPRESSIN 20 UNIT/ML IV SOLN
INTRAVENOUS | Status: DC | PRN
  Administered 2015-03-21: 20 [IU]

## 2015-03-21 MED ORDER — MIDAZOLAM HCL 2 MG/2ML IJ SOLN
INTRAMUSCULAR | Status: AC
  Filled 2015-03-21: qty 2

## 2015-03-21 MED ORDER — OXYCODONE HCL 5 MG PO TABS: 5.0000 mg | ORAL_TABLET | Freq: Once | ORAL | Status: DC | PRN

## 2015-03-21 MED ORDER — ONDANSETRON HCL 4 MG/2ML IJ SOLN
INTRAMUSCULAR | Status: AC
  Filled 2015-03-21: qty 2

## 2015-03-21 MED ORDER — LACTATED RINGERS IV SOLN
INTRAVENOUS | Status: DC
  Administered 2015-03-21 (×2): via INTRAVENOUS

## 2015-03-21 MED ORDER — SODIUM CHLORIDE 0.9 % IV SOLN
INTRAVENOUS | Status: DC | PRN
  Administered 2015-03-21: 120 mL

## 2015-03-21 MED ORDER — FENTANYL CITRATE (PF) 100 MCG/2ML IJ SOLN
INTRAMUSCULAR | Status: DC | PRN
  Administered 2015-03-21: 100 ug via INTRAVENOUS
  Administered 2015-03-21: 50 ug via INTRAVENOUS
  Administered 2015-03-21: 100 ug via INTRAVENOUS
  Administered 2015-03-21 (×2): 50 ug via INTRAVENOUS

## 2015-03-21 MED ORDER — SCOPOLAMINE 1 MG/3DAYS TD PT72
MEDICATED_PATCH | TRANSDERMAL | Status: AC
  Administered 2015-03-21: 1.5 mg via TRANSDERMAL
  Filled 2015-03-21: qty 1

## 2015-03-21 MED ORDER — ONDANSETRON HCL 4 MG PO TABS: 4.0000 mg | ORAL_TABLET | Freq: Four times a day (QID) | ORAL | Status: AC | PRN

## 2015-03-21 MED ORDER — VASOPRESSIN 20 UNIT/ML IV SOLN
INTRAVENOUS | Status: AC
  Filled 2015-03-21: qty 1

## 2015-03-21 MED ORDER — LIDOCAINE HCL (CARDIAC) 20 MG/ML IV SOLN
INTRAVENOUS | Status: DC | PRN
  Administered 2015-03-21: 50 mg via INTRAVENOUS

## 2015-03-21 MED ORDER — CEFAZOLIN SODIUM-DEXTROSE 2-3 GM-% IV SOLR
INTRAVENOUS | Status: AC
  Filled 2015-03-21: qty 50

## 2015-03-21 MED ORDER — CEFAZOLIN SODIUM-DEXTROSE 2-3 GM-% IV SOLR
2.0000 g | INTRAVENOUS | Status: AC
  Administered 2015-03-21: 2 g via INTRAVENOUS

## 2015-03-21 MED ORDER — KETOROLAC TROMETHAMINE 30 MG/ML IJ SOLN: 30.0000 mg | Freq: Once | INTRAMUSCULAR | Status: DC

## 2015-03-21 MED ORDER — LIDOCAINE HCL (CARDIAC) 20 MG/ML IV SOLN
INTRAVENOUS | Status: AC
  Filled 2015-03-21: qty 5

## 2015-03-21 MED ORDER — SODIUM CHLORIDE 0.9 % IJ SOLN
INTRAMUSCULAR | Status: AC
  Filled 2015-03-21: qty 50

## 2015-03-21 MED ORDER — KETOROLAC TROMETHAMINE 30 MG/ML IJ SOLN: 30.0000 mg | Freq: Once | INTRAMUSCULAR | Status: DC | PRN

## 2015-03-21 MED ORDER — DEXAMETHASONE SODIUM PHOSPHATE 10 MG/ML IJ SOLN
INTRAMUSCULAR | Status: DC | PRN
  Administered 2015-03-21: 4 mg via INTRAVENOUS

## 2015-03-21 MED ORDER — FENTANYL CITRATE (PF) 100 MCG/2ML IJ SOLN
INTRAMUSCULAR | Status: AC
  Filled 2015-03-21: qty 2

## 2015-03-21 MED ORDER — PROPOFOL 10 MG/ML IV BOLUS
INTRAVENOUS | Status: DC | PRN
  Administered 2015-03-21: 40 mg via INTRAVENOUS
  Administered 2015-03-21: 200 mg via INTRAVENOUS
  Administered 2015-03-21: 20 mg via INTRAVENOUS

## 2015-03-21 MED ORDER — ROCURONIUM BROMIDE 100 MG/10ML IV SOLN
INTRAVENOUS | Status: AC
  Filled 2015-03-21: qty 1

## 2015-03-21 MED ORDER — KETOROLAC TROMETHAMINE 30 MG/ML IJ SOLN
INTRAMUSCULAR | Status: DC | PRN
  Administered 2015-03-21: 30 mg via INTRAVENOUS

## 2015-03-21 MED ORDER — DEXAMETHASONE SODIUM PHOSPHATE 4 MG/ML IJ SOLN
INTRAMUSCULAR | Status: AC
  Filled 2015-03-21: qty 1

## 2015-03-21 MED ORDER — FENTANYL CITRATE (PF) 250 MCG/5ML IJ SOLN
INTRAMUSCULAR | Status: AC
  Filled 2015-03-21: qty 5

## 2015-03-21 MED ORDER — LACTATED RINGERS IR SOLN
Status: DC | PRN
  Administered 2015-03-21: 3000 mL

## 2015-03-21 SURGICAL SUPPLY — 57 items
BARRIER ADHS 3X4 INTERCEED (GAUZE/BANDAGES/DRESSINGS) ×2 IMPLANT
BLADE LAP MORCELLATOR 15MMX9.5 (ELECTROSURGICAL) ×1
BLADE LAP MORCELLATOR 15X9.5 (ELECTROSURGICAL) ×1 IMPLANT
BRR ADH 4X3 ABS CNTRL BYND (GAUZE/BANDAGES/DRESSINGS) ×1
CATH FOLEY 3WAY  5CC 16FR (CATHETERS) ×2
CATH FOLEY 3WAY 5CC 16FR (CATHETERS) IMPLANT
CHLORAPREP W/TINT 26ML (MISCELLANEOUS) ×1 IMPLANT
CLOTH BEACON ORANGE TIMEOUT ST (SAFETY) ×3 IMPLANT
CONT PATH 16OZ SNAP LID 3702 (MISCELLANEOUS) ×3 IMPLANT
COVER BACK TABLE 60X90IN (DRAPES) ×6 IMPLANT
COVER TIP SHEARS 8 DVNC (MISCELLANEOUS) ×1 IMPLANT
COVER TIP SHEARS 8MM DA VINCI (MISCELLANEOUS) ×2
DECANTER SPIKE VIAL GLASS SM (MISCELLANEOUS) ×9 IMPLANT
DRAPE WARM FLUID 44X44 (DRAPE) ×3 IMPLANT
DRSG COVADERM PLUS 2X2 (GAUZE/BANDAGES/DRESSINGS) ×12 IMPLANT
DRSG OPSITE POSTOP 3X4 (GAUZE/BANDAGES/DRESSINGS) ×3 IMPLANT
ELECT REM PT RETURN 9FT ADLT (ELECTROSURGICAL) ×3
ELECTRODE REM PT RTRN 9FT ADLT (ELECTROSURGICAL) ×1 IMPLANT
GAUZE VASELINE 3X9 (GAUZE/BANDAGES/DRESSINGS) IMPLANT
GLOVE BIO SURGEON STRL SZ7 (GLOVE) ×6 IMPLANT
GLOVE BIOGEL PI IND STRL 7.0 (GLOVE) ×2 IMPLANT
GLOVE BIOGEL PI INDICATOR 7.0 (GLOVE) ×8
KIT ACCESSORY DA VINCI DISP (KITS) ×2
KIT ACCESSORY DVNC DISP (KITS) ×1 IMPLANT
LEGGING LITHOTOMY PAIR STRL (DRAPES) ×3 IMPLANT
LIQUID BAND (GAUZE/BANDAGES/DRESSINGS) ×3 IMPLANT
MANIPULATOR UTERINE 4.5 ZUMI (MISCELLANEOUS) ×2 IMPLANT
OCCLUDER COLPOPNEUMO (BALLOONS) ×3 IMPLANT
PACK ROBOT WH (CUSTOM PROCEDURE TRAY) ×3 IMPLANT
PACK ROBOTIC GOWN (GOWN DISPOSABLE) ×3 IMPLANT
PAD POSITIONER PINK NONSTERILE (MISCELLANEOUS) ×3 IMPLANT
SET CYSTO W/LG BORE CLAMP LF (SET/KITS/TRAYS/PACK) IMPLANT
SET IRRIG TUBING LAPAROSCOPIC (IRRIGATION / IRRIGATOR) ×3 IMPLANT
SET TRI-LUMEN FLTR TB AIRSEAL (TUBING) ×2 IMPLANT
SUT MNCRL AB 4-0 PS2 18 (SUTURE) ×4 IMPLANT
SUT VIC AB 0 CT1 27 (SUTURE) ×9
SUT VIC AB 0 CT1 27XBRD ANTBC (SUTURE) ×3 IMPLANT
SUT VICRYL 0 UR6 27IN ABS (SUTURE) ×3 IMPLANT
SUT VLOC 180 0 9IN  GS21 (SUTURE) ×4
SUT VLOC 180 0 9IN GS21 (SUTURE) IMPLANT
SUT VLOC 180 2-0 9IN GS21 (SUTURE) ×4 IMPLANT
SYR 50ML LL SCALE MARK (SYRINGE) ×3 IMPLANT
SYSTEM CONVERTIBLE TROCAR (TROCAR) ×2 IMPLANT
TIP UTERINE 5.1X6CM LAV DISP (MISCELLANEOUS) IMPLANT
TIP UTERINE 6.7X10CM GRN DISP (MISCELLANEOUS) IMPLANT
TIP UTERINE 6.7X6CM WHT DISP (MISCELLANEOUS) IMPLANT
TIP UTERINE 6.7X8CM BLUE DISP (MISCELLANEOUS) IMPLANT
TOWEL OR 17X24 6PK STRL BLUE (TOWEL DISPOSABLE) ×6 IMPLANT
TRAY FOLEY CATH SILVER 14FR (SET/KITS/TRAYS/PACK) ×3 IMPLANT
TROCAR 12M 150ML BLUNT (TROCAR) ×3 IMPLANT
TROCAR BALLN GELPORT 12X130M (ENDOMECHANICALS) ×3 IMPLANT
TROCAR DISP BLADELESS 8 DVNC (TROCAR) ×1 IMPLANT
TROCAR DISP BLADELESS 8MM (TROCAR) ×2
TROCAR PORT AIRSEAL 5X120 (TROCAR) ×2 IMPLANT
TROCAR XCEL 12X100 BLDLESS (ENDOMECHANICALS) ×3 IMPLANT
WARMER LAPAROSCOPE (MISCELLANEOUS) ×3 IMPLANT
WATER STERILE IRR 1000ML POUR (IV SOLUTION) ×9 IMPLANT

## 2015-03-21 NOTE — Progress Notes (Signed)
Discharge instructions and prescriptions given, understanding voiced. Patient in wheelchair with belongings, transported by this RN to hospital exit/private auto. Husband to drive patient home.

## 2015-03-21 NOTE — Transfer of Care (Signed)
Immediate Anesthesia Transfer of Care Note  Patient: Mercedes Francis  Procedure(s) Performed: Procedure(s): ROBOTIC ASSISTED MYOMECTOMY With Morcellator (N/A)  Patient Location: PACU  Anesthesia Type:General  Level of Consciousness: awake, alert  and oriented  Airway & Oxygen Therapy: Patient Spontanous Breathing and Patient connected to nasal cannula oxygen  Post-op Assessment: Report given to RN and Post -op Vital signs reviewed and stable  Post vital signs: Reviewed and stable  Last Vitals:  Filed Vitals:   03/21/15 0608  BP: 115/79  Pulse: 72  Temp: 36.5 C  Resp: 20    Complications: No apparent anesthesia complications

## 2015-03-21 NOTE — Anesthesia Preprocedure Evaluation (Signed)
Anesthesia Evaluation  Patient identified by MRN, date of birth, ID band Patient awake    Reviewed: Allergy & Precautions, NPO status , Patient's Chart, lab work & pertinent test results  History of Anesthesia Complications Negative for: history of anesthetic complications  Airway Mallampati: I  TM Distance: >3 FB Neck ROM: Full    Dental  (+) Teeth Intact   Pulmonary neg pulmonary ROS,  breath sounds clear to auscultation        Cardiovascular negative cardio ROS  Rhythm:Regular     Neuro/Psych negative neurological ROS  negative psych ROS   GI/Hepatic negative GI ROS, Neg liver ROS,   Endo/Other  negative endocrine ROS  Renal/GU negative Renal ROS     Musculoskeletal negative musculoskeletal ROS (+)   Abdominal   Peds  Hematology negative hematology ROS (+)   Anesthesia Other Findings   Reproductive/Obstetrics                             Anesthesia Physical Anesthesia Plan  ASA: I  Anesthesia Plan: General   Post-op Pain Management:    Induction: Intravenous  Airway Management Planned: Oral ETT  Additional Equipment: None  Intra-op Plan:   Post-operative Plan: Extubation in OR  Informed Consent: I have reviewed the patients History and Physical, chart, labs and discussed the procedure including the risks, benefits and alternatives for the proposed anesthesia with the patient or authorized representative who has indicated his/her understanding and acceptance.   Dental advisory given  Plan Discussed with: CRNA and Surgeon  Anesthesia Plan Comments:         Anesthesia Quick Evaluation

## 2015-03-21 NOTE — Anesthesia Procedure Notes (Signed)
Procedure Name: Intubation Date/Time: 03/21/2015 7:41 AM Performed by: Jonna Munro Pre-anesthesia Checklist: Patient identified, Emergency Drugs available, Suction available, Patient being monitored and Timeout performed Patient Re-evaluated:Patient Re-evaluated prior to inductionOxygen Delivery Method: Circle system utilized Preoxygenation: Pre-oxygenation with 100% oxygen Intubation Type: IV induction Ventilation: Mask ventilation without difficulty Grade View: Grade II Tube type: Oral Tube size: 7.0 mm Number of attempts: 1 Airway Equipment and Method: Stylet Placement Confirmation: ETT inserted through vocal cords under direct vision,  positive ETCO2 and breath sounds checked- equal and bilateral Secured at: 22 cm Tube secured with: Tape Dental Injury: Teeth and Oropharynx as per pre-operative assessment

## 2015-03-21 NOTE — Progress Notes (Signed)
Day of Surgery Procedure(s) (LRB): ROBOTIC ASSISTED MYOMECTOMY With Morcellator (N/A)  Subjective: Patient reports tolerating PO, + flatus and no problems voiding.  Pain well controlled with Percocet  Objective: I have reviewed patient's vital signs, intake and output and medications. BP 106/65 mmHg  Pulse 84  Temp(Src) 97.9 F (36.6 C) (Oral)  Resp 16  Ht 5\' 4"  (1.626 m)  Wt 171 lb (77.565 kg)  BMI 29.34 kg/m2  SpO2 97%  General: alert and cooperative Resp: clear to auscultation bilaterally Cardio: regular rate and rhythm, S1, S2 normal, no murmur, click, rub or gallop GI: soft, non-tender; bowel sounds normal; no masses,  no organomegaly Extremities: Homans sign is negative, no sign of DVT Vaginal Bleeding: minimal  Assessment: s/p Procedure(s): ROBOTIC ASSISTED MYOMECTOMY With Morcellator (N/A): stable, progressing well and tolerating diet  Plan: Discharge home. Post op care and warning s/s, restrictions and f/up reviewed.  Surgical findings reviewed.      Mercedes Francis R 03/21/2015, 8:36 PM

## 2015-03-21 NOTE — Anesthesia Postprocedure Evaluation (Signed)
  Anesthesia Post-op Note  Patient: Mercedes Francis  Procedure(s) Performed: Procedure(s): ROBOTIC ASSISTED MYOMECTOMY With Morcellator (N/A)  Patient Location: Women's Unit  Anesthesia Type:General  Level of Consciousness: awake  Airway and Oxygen Therapy: Patient Spontanous Breathing  Post-op Pain: mild  Post-op Assessment: Patient's Cardiovascular Status Stable and Respiratory Function Stable  Post-op Vital Signs: stable  Last Vitals:  Filed Vitals:   03/21/15 1615  BP: 109/60  Pulse:   Temp:   Resp:     Complications: No apparent anesthesia complications

## 2015-03-21 NOTE — Discharge Summary (Signed)
Physician Discharge Summary   Patient ID: Mercedes Francis [id number] 35 y.o. February 19, 1981  Admit date: 03/21/2015  Discharge date and time: 03/21/2015  9:46 PM   Admitting Physician: Azucena Fallen, MD   Discharge Physician: Azucena Fallen, MD  Admission Diagnoses: Uterine Fibroids, Pelvic Pain, Menorrhagia  Discharge Diagnoses: S/p daVinci assisted laparoscopic myomectomy  Discharged Condition: good  Hospital Course: Uncomplicated post-op recovery and extended outpatient observation stay. Stable vitals, voided after foley removed, tolerated general diet, pain well controlled with Percocet and ambulated well.    Disposition: 01-Home or Self Care  Patient Instructions:    Medication List    TAKE these medications        ondansetron 4 MG tablet  Commonly known as:  ZOFRAN  Take 1 tablet (4 mg total) by mouth every 6 (six) hours as needed for nausea.     oxyCODONE-acetaminophen 5-325 MG per tablet  Commonly known as:  PERCOCET/ROXICET  Take 1-2 tablets by mouth every 4 (four) hours as needed for severe pain (moderate to severe pain (when tolerating fluids)).       Activity: activity as tolerated, no lifting, driving, or strenuous exercise for 2 weeks, no driving for 2 weeks, no sex for 4 weeks weeks and no driving while on analgesics Diet: regular diet Wound Care: keep wound clean and dry  Follow-up with Dr Benjie Karvonen in 2 week.  Signed: [name] R 03/22/2015 5:46 PM

## 2015-03-21 NOTE — Addendum Note (Signed)
Addendum  created 03/21/15 1627 by Ignacia Bayley, CRNA   Modules edited: Notes Section   Notes Section:  File: [id number]

## 2015-03-21 NOTE — H&P (Signed)
Mercedes Francis is an 34 y.o. female. Here for davinci myomectomy and morcellation for pelvic pain from single fibroid and desires uterine conservation Healthy, normal menses. Normal Paps. No breast complaints. LMP 3 wks.    Past Medical History  Diagnosis Date  . H/O miscarriage, not currently pregnant     x 3 - no surgery required  . SVD (spontaneous vaginal delivery)     x 4  . Kidney stone     passed stone - no surgery required    Past Surgical History  Procedure Laterality Date  . Anterior cruciate ligament repair Right     Family History  Problem Relation Age of Onset  . Hyperlipidemia Mother   . Obesity Sister     Social History:  reports that she has never smoked. She has never used smokeless tobacco. She reports that she does not drink alcohol or use illicit drugs.  Allergies:  Allergies  Allergen Reactions  . Morphine And Related Nausea And Vomiting    No prescriptions prior to admission    ROS neg  Blood pressure 115/79, pulse 72, temperature 97.7 F (36.5 C), temperature source Oral, resp. rate 20, SpO2 100 %. Physical Exam A&O x 3, no acute distress. Pleasant HEENT neg, no thyromegaly Lungs CTA bilat CV RRR, S1S2 normal Abdo soft, non tender, non acute Extr no edema/ tenderness Pelvic enlarged uterus 15 cm, with single 7 cm myoma   Results for orders placed or performed during the hospital encounter of 03/21/15 (from the past 24 hour(s))  Pregnancy, urine     Status: None   Collection Time: 03/21/15  6:10 AM  Result Value Ref Range   Preg Test, Ur NEGATIVE NEGATIVE    No results found.  Assessment/Plan: 34 yo, single fibroid here for [employer] myomectomy with morcellation of myoma.  Risks/complications of surgery reviewed incl infection, bleeding, damage to internal organs including bladder, bowels, ureters, blood vessels, other risks from anesthesia, VTE and delayed complications of any surgery, complications in future surgery reviewed.   Reviewed myoma morcellation. And small rare risk of possible sarcoma and spread. Reviewed criteria and pt qualifies.    Jalyn Rosero R 03/21/2015, 7:16 AM

## 2015-03-21 NOTE — Op Note (Signed)
Preoperative diagnosis:  Symptomatic uterine fibroid Postop diagnosis: Same Procedure: da Vinci robot assisted laparoscopic myomectomy Anesthesia Gen. Endotracheal Surgeon: Dr. Azucena Fallen Assistant: Dr Thomes Cake IV fluids: 1400 cc LR EBL: 150 cc Urine output: 308 cc, clear Complications: none Pathology: Morcellated pieces of uterine myoma Disposition: PACU, stable Findings: Enlarged uterus with single 7.5 cm posterior fundal myoma. Normal tubes and ovaries. Normal bowel, appendix, liver.   Procedure:  Indication: -- Pelvic/abdominal mass from large 7.5 cm right posterior myoma causing discomfort. Uterine conservation desires though husband is s/p vasectomy and no pregnancies planned after this.   Complications of surgery including infection, heavy bleeding, damage to internal organs especially uterus and other surgery related problems including pneumonia, VTE reviewed and informed written consent was obtained.  Reviewed need for morcellator use and small but possible risk of myoma having sarcoma and then sarcoma upstaging due to fragments of myoma spill during morcellation or alternatively proceed with open myomectomy. Considering age, appearance on sono and after counseling, laparoscopic route was desired. She understood and gave informed written consent.  Patient was brought to the operating room with IV running. She received 2 gm Ancef . Underwent general anesthesia without difficulty and was given dorsal lithotomy position, prepped and draped in sterile fashion. Foley catheter was placed. Cervix was exposed with a speculum and anterior lip of the cervix was grasped with tenaculum. 10 cc 20:50 dilute pitressin was injected for paracervical block to reduce bleeding. Uterus was sounded to 8 cm. A  Zumi manipulator was inserted and secured in place with balloon, tenaculum, speculum removed.  Attention was focused on abdomen. Supraumbilical 12 mm vertical incision made with scalpel after  injecting Marcaine, fascia dissected, grasped with Kocher's and incised, posterior rectus sheath and peritoneum grasped, incised, intraabdominal entry confirmed. Purse string stay stitch on 0-Vicryl taken on fascia and Hassan cannula introduced and Vicryl sutures secured on the Hassan cannula. Pneumoperitoneum was begun.  Laparoscope was introduced and the peritoneal cavity was evaluated. There was no evidence of adhesions. Trendelenburg position given. Uterus was enlarged with large right posterior fundal myoma and normal tubes and ovaries noted. Rest of the abdomen was normal.  4 Port sited marked and injected with Marcaine. Left upper was assistant port and left lower and 2 right ports were Robotic cannulas, all inserted under vision. Insufflation site switched. Robot was docked from right side.   Dr. Benjie Karvonen scrubbed out and went for surgical console and Dr Dellis Filbert stayed at bedside to assist.  30 cc of 20:50 dilute Pitressin was injected into the myoma via spinal needle brought in from upper abdominal wall under vision and controlled by the surgeon. Blanching noted well. A linear serosal incision was made about 6-7 cm long from fundal aspect over the myoma going posteriorly with endoscissors. Incision was carried down until fibroid was noted. Two serosal edges were grasped and with blunt and sharp dissection using cautery as needed myoma was dissected off the serosal and myometrial bed using tenaculums for traction (surgeon and assistant ports). Hemostasis was obtained as we moved towards the base and with traction and dissection, entire myoma was enucleated. Hemostasis was excellent. Myoma was placed in left mid abdomen since it was too large to fit in the cul de sac.   Instruments were switched for needle drivers and Prograsp. 0-V-lock was used to close myoma bed in two layers and then 2-0 V-lock used to closed serosa. Hemostasis was excellent.  All needles were removed as they were freed at the end  of  each layer and counts were correct. All instruments removed and robot was de-docked and moved away. Patient was removed from steep trendelenburg.  #8 mm scope switched and placed through right upper port. Central cannula removed and morcellator was inserted in that space under vision. Uterine fibroid was pulled up to the morcellator and was morcellated under vision and removed in pieces in stepwise fashion and all pieces were removed. Morcellator was removed, central port placed and #10 Laparoscope switched. Interceed placed on myomectomy suture line and hemostasis was excellent at lower pneumoperitoneal pressure.  All instruments removed after aspirating peritoneal fluid.  Central port stay sutures tied off with excellent fascial closure. Skin approximated with subcuticular stitches on 4-0 Vicryl. Dermabond was applied.  No vaginal bleeding noted on vaginal exam at the end. All instruments/lap/sponges counts were correct x2.  No complications. Patient tolerated procedure well and was reversed from anesthesia and brought to the PACU stable condition. Foley catheter to be removed in PACU.  Dr Benjie Karvonen was the surgeon for entire case.

## 2015-03-21 NOTE — Discharge Instructions (Signed)
Diagnostic Laparoscopy. You had laparoscopic myomectomy Laparoscopy is a surgical procedure. It is used to diagnose and treat diseases inside the belly (abdomen). It is usually a brief, common, and relatively simple procedure. The laparoscopeis a thin, lighted, pencil-sized instrument. It is like a telescope. It is inserted into your abdomen through a small cut (incision). Your caregiver can look at the organs inside your body through this instrument. He or she can see if there is anything abnormal. Laparoscopy can be done either in a hospital or outpatient clinic. You may be given a mild sedative to help you relax before the procedure. Once in the operating room, you will be given a drug to make you sleep (general anesthesia). Laparoscopy usually lasts less than 1 hour. After the procedure, you will be monitored in a recovery area until you are stable and doing well. Once you are home, it will take 2 to 3 days to fully recover. RISKS AND COMPLICATIONS  Laparoscopy has relatively few risks. Your caregiver will discuss the risks with you before the procedure. Some problems that can occur include:  Infection.  Bleeding.  Damage to other organs.  Anesthetic side effects. PROCEDURE Once you receive anesthesia, your surgeon inflates the abdomen with a harmless gas (carbon dioxide). This makes the organs easier to see. The laparoscope is inserted into the abdomen through a small incision. This allows your surgeon to see into the abdomen. Other small instruments are also inserted into the abdomen through other small openings. Many surgeons attach a video camera to the laparoscope to enlarge the view. During a diagnostic laparoscopy, the surgeon may be looking for inflammation, infection, or cancer. Your surgeon may take tissue samples(biopsies). The samples are sent to a specialist in looking at cells and tissue samples (pathologist). The pathologist examines them under a microscope. Biopsies can help to  diagnose or confirm a disease. AFTER THE PROCEDURE   The gas is released from inside the abdomen.  The incisions are closed with stitches (sutures). Because these incisions are small (usually less than 1/2 inch), there is usually minimal discomfort after the procedure. There may be some mild discomfort in the throat. This is from the tube placed in the throat while you were sleeping. You may have some mild abdominal discomfort. There may also be discomfort from the instrument placement incisions in the abdomen.  The recovery time is shortened as long as there are no complications.  You will rest in a recovery room until stable and doing well. As long as there are no complications, you may be allowed to go home. FINDING OUT THE RESULTS OF YOUR TEST Not all test results are available during your visit. If your test results are not back during the visit, make an appointment with your caregiver to find out the results. Do not assume everything is normal if you have not heard from your caregiver or the medical facility. It is important for you to follow up on all of your test results. HOME CARE INSTRUCTIONS   Take all medicines as directed.  Only take over-the-counter or prescription medicines for pain, discomfort, or fever as directed by your caregiver.  Resume daily activities as directed.  Showers are preferred over baths.  You may resume sexual activities in 1 week or as directed.  Do not drive while taking narcotics. SEEK MEDICAL CARE IF:   There is increasing abdominal pain.  There is new pain in the shoulders (shoulder strap areas).  You feel lightheaded or faint.  You have the  chills.  You or your child has an oral temperature above 102 F (38.9 C).  There is pus-like (purulent) drainage from any of the wounds.  You are unable to pass gas or have a bowel movement.  You feel sick to your stomach (nauseous) or throw up (vomit). MAKE SURE YOU:   Understand these  instructions.  Will watch your condition.  Will get help right away if you are not doing well or get worse. Document Released: 01/24/2001 Document Revised: 02/12/2013 Document Reviewed: 10/18/2007 [facility] Patient Information 2015 [place], [place]. This information is not intended to replace advice given to you by your health care provider. Make sure you discuss any questions you have with your health care provider. Myomectomy, Care After Refer to this sheet in the next few weeks. These instructions provide you with information on caring for yourself after your procedure. Your health care provider may also give you more specific instructions. Your treatment has been planned according to current medical practices, but problems sometimes occur. Call your health care provider if you have any problems or questions after your procedure. WHAT TO EXPECT AFTER THE PROCEDURE After your procedure, it is typical to have the following:  Pain in your abdomen, especially at any incision sites. You will be given pain medicine to control the pain.  Tiredness. This is a normal part of the recovery process. Your energy level will return to normal over the next several weeks.  Constipation.  Vaginal bleeding. This is normal and should stop after 1-2 weeks. HOME CARE INSTRUCTIONS   Only take over-the-counter or prescription medicines as directed by your health care provider. Avoid aspirin because it can cause bleeding.  Do not douche, use tampons, or have sexual intercourse until given permission by your health care provider.  Remove or change any bandages (dressings) as directed by your health care provider.  Take showers instead of baths as directed by your health care provider.  You will probably be able to go back to your normal routine after a few days. Do not do anything that requires extra effort until your health care provider says it is okay. Do not lift anything heavier than 15 pounds (6.8 kg)  until your health care provider approves.  Walk daily but take frequent rest breaks if you tire easily.  Continue to practice deep breathing and coughing. If it hurts to cough, try holding a pillow against your belly as you cough.  If you become constipated, you may:  Use a mild laxative if your health care provider approves.  Add more fruit and bran to your diet.  Drink enough fluids to keep your urine clear or pale yellow.  Take your temperature twice a day and write it down.  Do not drink alcohol.  Do not drive until your health care provider approves.  Have someone help you at home for 1 week or until you can do your own household activities.  Follow up with your health care provider as directed. SEEK MEDICAL CARE IF:  You have a fever.  You have increasing abdominal pain that is not relieved with medicine.  You have nausea, vomiting, or diarrhea.  You have pain when you urinate, or you have blood in your urine.  You have a rash on your body.  You have pain or redness where your IV access tube was inserted.  You have redness, swelling, or any kind of drainage from an incision. SEEK IMMEDIATE MEDICAL CARE IF:   You have weakness or lightheadedness.  You have pain, swelling, or redness in your legs.  You have chest pain.  You faint.  You have shortness of breath.  You have heavy vaginal bleeding.  Your incision is opening up. Document Released: 03/10/2011 Document Revised: 08/08/2013 Document Reviewed: 05/30/2013 [facility] Patient Information 2015 [place], [place]. This information is not intended to replace advice given to you by your health care provider. Make sure you discuss any questions you have with your health care provider.

## 2015-03-21 NOTE — Anesthesia Postprocedure Evaluation (Signed)
  Anesthesia Post-op Note  Patient: Mercedes Francis  Procedure(s) Performed: Procedure(s): ROBOTIC ASSISTED MYOMECTOMY With Morcellator (N/A)  Patient Location: PACU  Anesthesia Type:General  Level of Consciousness: awake  Airway and Oxygen Therapy: Patient Spontanous Breathing and Patient connected to nasal cannula oxygen  Post-op Pain: none  Post-op Assessment: Post-op Vital signs reviewed, Patient's Cardiovascular Status Stable, Respiratory Function Stable, Patent Airway, No signs of Nausea or vomiting and Pain level controlled  Post-op Vital Signs: Reviewed and stable  Last Vitals:  Filed Vitals:   03/21/15 1200  BP: 103/58  Pulse: 60  Temp:   Resp: 16    Complications: No apparent anesthesia complications

## 2015-03-24 ENCOUNTER — Encounter (HOSPITAL_COMMUNITY): Payer: Self-pay | Admitting: Obstetrics & Gynecology

## 2015-03-24 LAB — TYPE AND SCREEN
ABO/RH(D): O POS
Antibody Screen: POSITIVE
DAT, IgG: NEGATIVE
Donor AG Type: NEGATIVE
Donor AG Type: NEGATIVE
UNIT DIVISION: 0
Unit division: 0
Unit division: 0
Unit division: 0

## 2016-10-15 IMAGING — US US TRANSVAGINAL NON-OB
1 series · 13 of 25 positions shown · non-contrast
Comparison: None

CLINICAL DATA: mass in abdomen. Comments: Suprapubic palpable area
x 1 month, larger in size since last month's discovery. Pt states
normal cycles, no pain with area. No urinary pain or urgency either.
History of 3 miscarriages and 4 live births. LMP 12/28/2014.

EXAM:
TRANSABDOMINAL AND TRANSVAGINAL ULTRASOUND OF PELVIS
TECHNIQUE: Both transabdominal and transvaginal ultrasound examinations of the
pelvis were performed. Transabdominal technique was performed for
global imaging of the pelvis including uterus, ovaries, adnexal
regions, and pelvic cul-de-sac. It was necessary to proceed with
endovaginal exam following the transabdominal exam to visualize the
endometrium and ovaries.

[Series 1: us transvaginal non-ob · 0.21mm/px · 92 acquisitions, 13 frames shown]
[im 1/92]
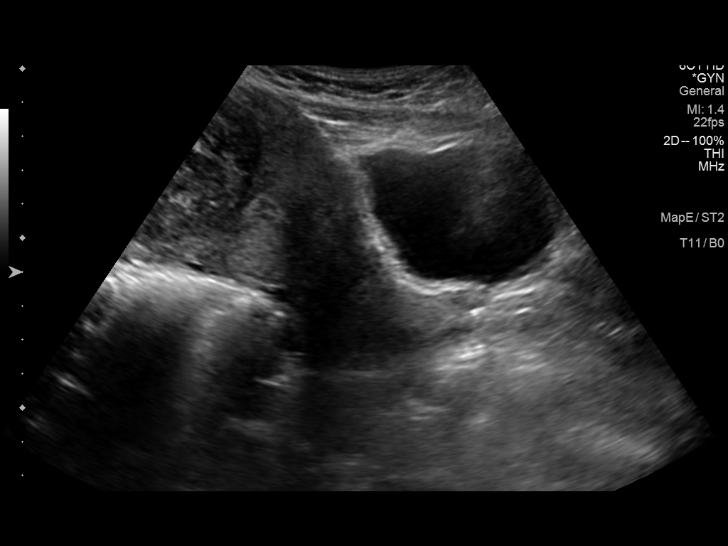
[im 8/92]
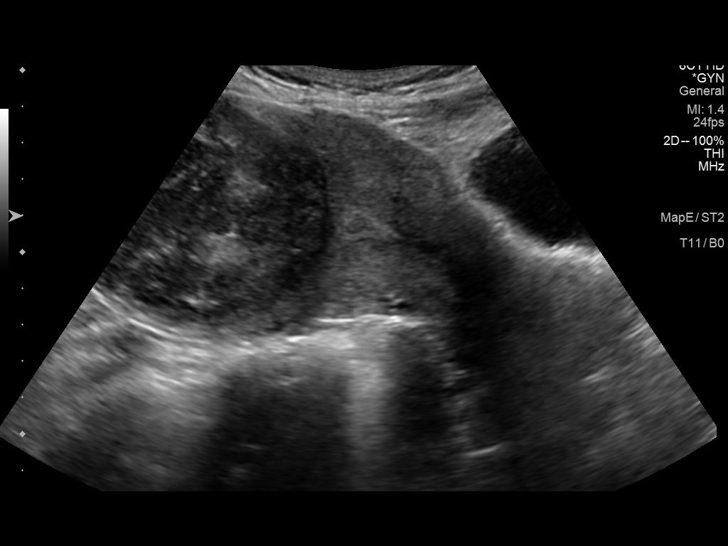
[im 16/92]
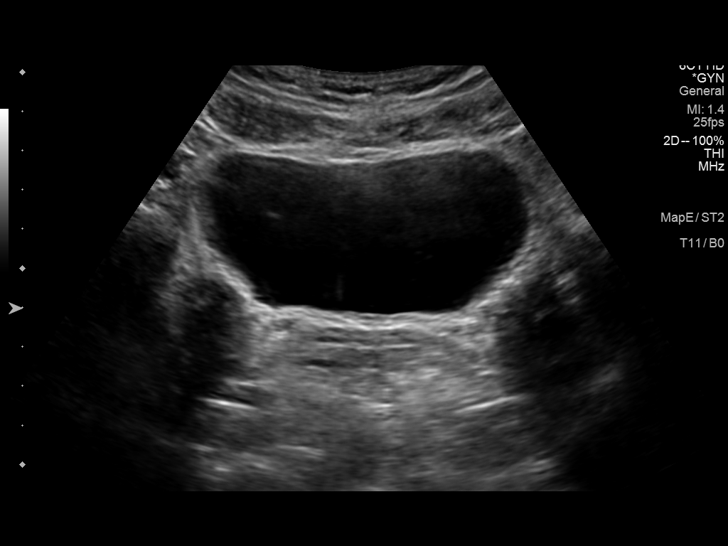
[im 23/92]
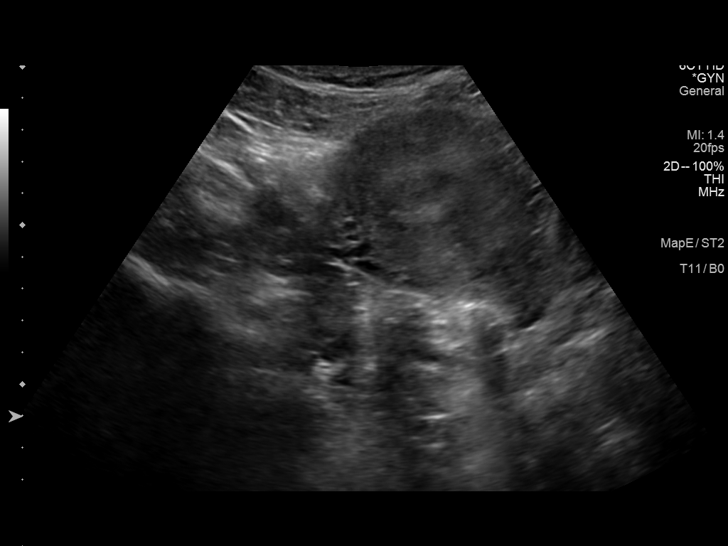
[im 31/92]
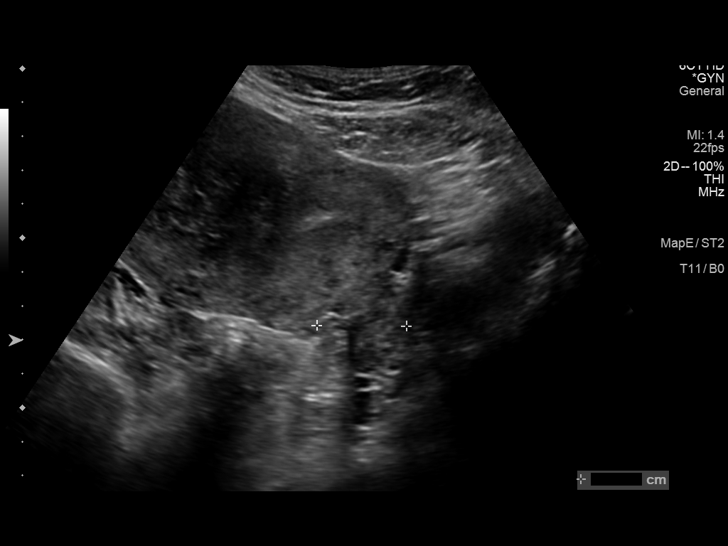
[im 38/92]
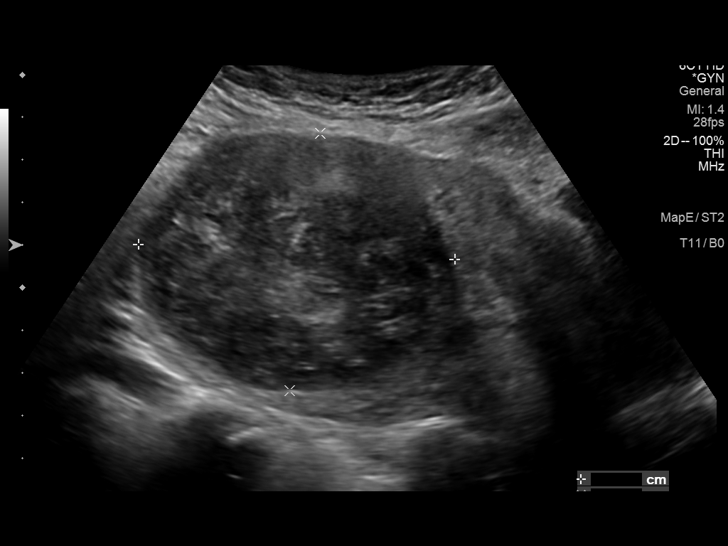
[im 46/92]
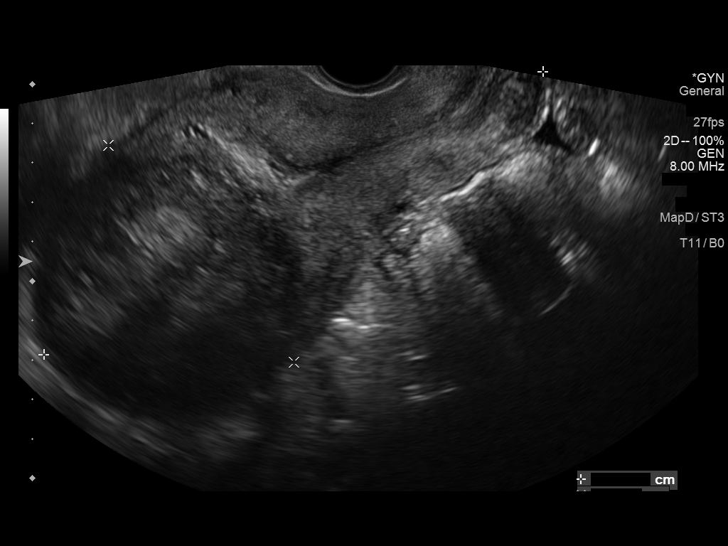
[im 54/92]
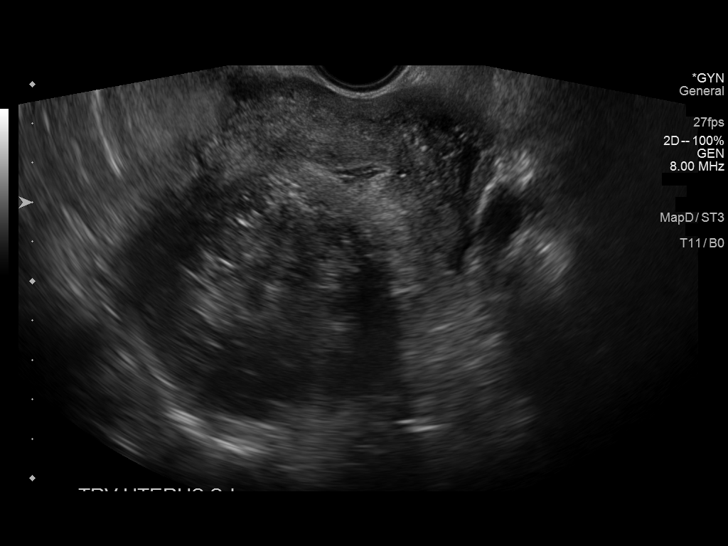
[im 61/92]
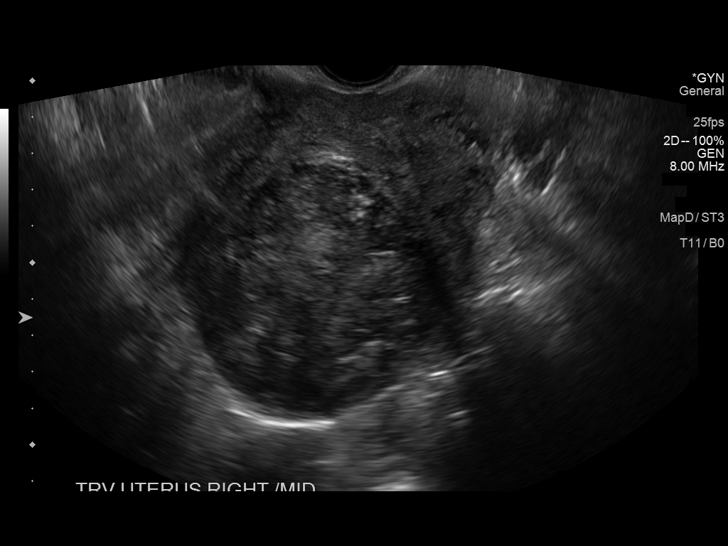
[im 69/92]
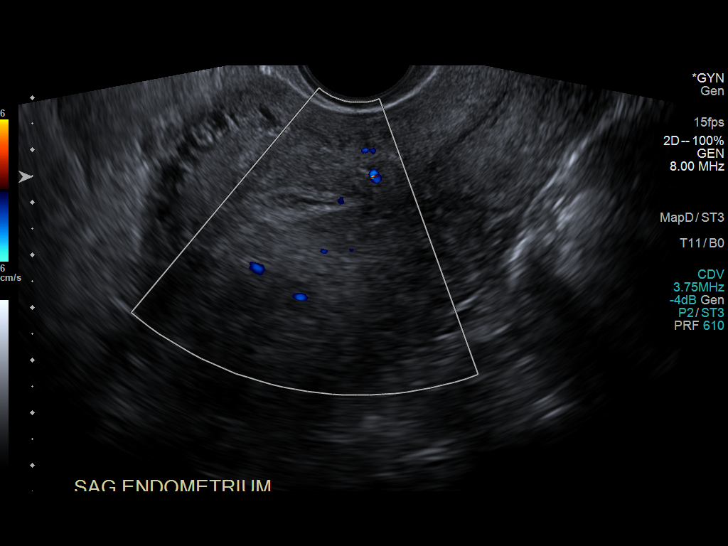
[im 76/92]
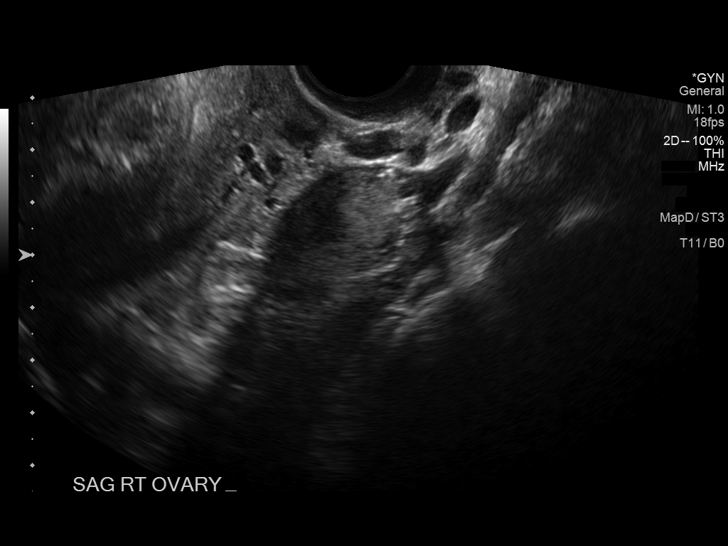
[im 84/92]
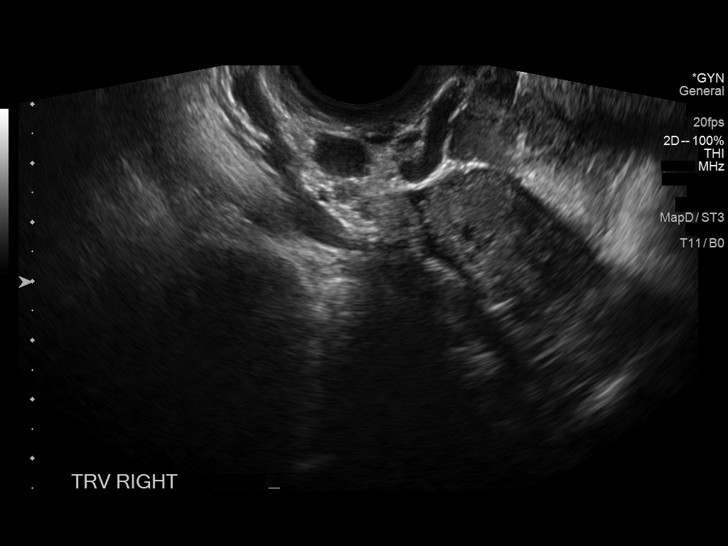
[im 92/92]
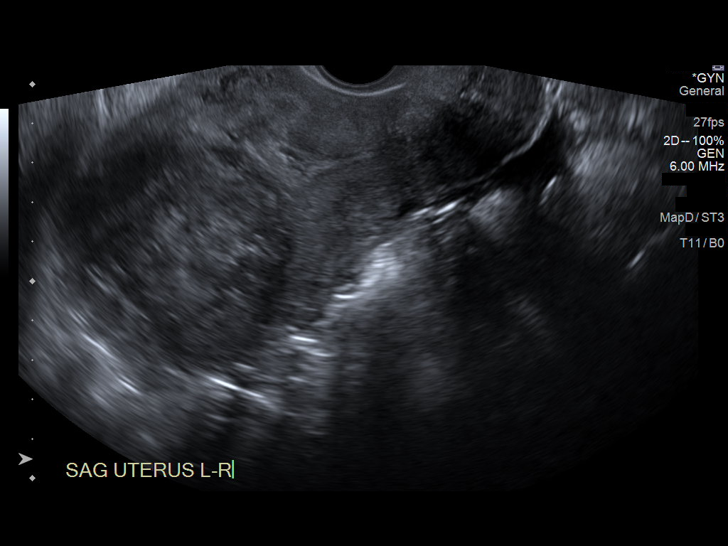

[13 of 25 positions shown; findings below may reference images not displayed]

FINDINGS: Uterus

Measurements: 6.9 x 9.3 x 14.2 cm. There is a round hypoechoic
intramural mass over the right posterior side of the uterine fundus
measuring 7.2 x 7.3 x 7.5 cm compatible with an intramural leiomyoma
likely accounting for patient's palpable abnormality.

Endometrium

Thickness: 8.8 mm.  No focal abnormality visualized.

Right ovary

Measurements: 2.4 x 1.8 x 4.5 cm. Normal appearance/no adnexal mass.

Left ovary

Measurements: 2.3 x 2.6 x 4.7 cm. Normal appearance/no adnexal mass.

Other findings

Trace free fluid.
IMPRESSION: Mildly enlarged uterus due to a intramural leiomyoma over the
posterior right side of the fundus measuring 7.2 x 7.3 x 7.5 cm
likely accounting for patient's palpable abnormality.
